# Patient Record
Sex: Male | Born: 1935 | Race: White | Hispanic: No | Marital: Married
Health system: Midwestern US, Academic
[De-identification: ages and names within clinical notes are randomized; demographics above are authoritative.]

---

## 2017-10-23 ENCOUNTER — Encounter: Admit: 2017-10-23 | Discharge: 2017-10-23 | Payer: MEDICARE

## 2017-10-23 ENCOUNTER — Encounter: Admit: 2017-10-23 | Discharge: 2017-10-24 | Payer: MEDICARE

## 2017-10-23 DIAGNOSIS — R160 Hepatomegaly, not elsewhere classified: Secondary | ICD-10-CM

## 2017-10-23 DIAGNOSIS — E785 Hyperlipidemia, unspecified: ICD-10-CM

## 2017-10-23 DIAGNOSIS — K2210 Ulcer of esophagus without bleeding: ICD-10-CM

## 2017-10-23 DIAGNOSIS — N19 Unspecified kidney failure: Principal | ICD-10-CM

## 2017-10-23 MED ORDER — PIPERACILLIN/TAZOBACTAM 4.5 G/NS IVPB (MB+)
4.5 g | INTRAVENOUS | 0 refills | Status: DC
Start: 2017-10-23 — End: 2017-10-24
  Administered 2017-10-24 (×4): 4.5 g via INTRAVENOUS

## 2017-10-24 ENCOUNTER — Encounter: Admit: 2017-10-24 | Discharge: 2017-10-24 | Payer: MEDICARE

## 2017-10-24 ENCOUNTER — Inpatient Hospital Stay
Admit: 2017-10-24 | Discharge: 2017-11-03 | Disposition: A | Discharge status: Skilled Nursing Facility | Payer: MEDICARE | Source: Other Acute Inpatient Hospital

## 2017-10-24 DIAGNOSIS — N19 Unspecified kidney failure: Principal | ICD-10-CM

## 2017-10-24 DIAGNOSIS — E785 Hyperlipidemia, unspecified: ICD-10-CM

## 2017-10-24 DIAGNOSIS — R160 Hepatomegaly, not elsewhere classified: Secondary | ICD-10-CM

## 2017-10-24 LAB — URINALYSIS DIPSTICK
Lab: NEGATIVE (ref 3–12)
Lab: NEGATIVE MMOL/L (ref 21–30)
Lab: NEGATIVE mL/min
Lab: NEGATIVE
Lab: POSITIVE — AB

## 2017-10-24 LAB — CA19.9: Lab: 27 U/mL — ABNORMAL HIGH (ref <35)

## 2017-10-24 LAB — COMPREHENSIVE METABOLIC PANEL
Lab: 1.3 mg/dL — ABNORMAL HIGH (ref 0.3–1.2)
Lab: 101 MMOL/L (ref 98–110)
Lab: 126 U/L — ABNORMAL HIGH (ref 7–40)
Lab: 133 MMOL/L — ABNORMAL LOW (ref 137–147)
Lab: 196 U/L — ABNORMAL HIGH (ref 7–56)
Lab: 229 U/L — ABNORMAL HIGH (ref 25–110)
Lab: 23 MMOL/L (ref 21–30)
Lab: 3.4 g/dL — ABNORMAL LOW (ref 3.5–5.0)
Lab: 55 mL/min — ABNORMAL LOW (ref >60)
Lab: 6.8 g/dL (ref 6.0–8.0)
Lab: 9 (ref 3–12)
Lab: >60 mL/min (ref >60)

## 2017-10-24 LAB — URINALYSIS, MICROSCOPIC

## 2017-10-24 LAB — BILIRUBIN, DIRECT: Lab: 0.8 mg/dL — ABNORMAL HIGH (ref <0.4)

## 2017-10-24 LAB — SODIUM-URINE RANDOM: Lab: 15 MMOL/L

## 2017-10-24 LAB — ALPHA FETO PROTEIN (AFP): Lab: 3.4 ng/mL — ABNORMAL HIGH (ref 0.0–15.0)

## 2017-10-24 LAB — CREATININE-URINE RANDOM: Lab: 144 mg/dL

## 2017-10-24 LAB — CBC
Lab: 18 10*3/uL — ABNORMAL HIGH (ref 4.5–11.0)
Lab: 32 pg — ABNORMAL HIGH (ref 26–34)
Lab: 4 M/UL — ABNORMAL LOW (ref 4.4–5.5)

## 2017-10-24 LAB — LACTIC ACID(LACTATE): Lab: 1.7 MMOL/L (ref 0.5–2.0)

## 2017-10-24 LAB — PROTIME INR (PT): Lab: 1.3 MMOL/L — ABNORMAL HIGH (ref 0.8–1.2)

## 2017-10-24 LAB — CEA(CARCINOEMBRYONIC AG): Lab: 2 ng/mL (ref <3.0)

## 2017-10-24 LAB — UREA NITROGEN-URINE RANDOM: Lab: 118 mg/dL

## 2017-10-24 MED ORDER — ONDANSETRON HCL (PF) 4 MG/2 ML IJ SOLN
4 mg | INTRAVENOUS | 0 refills | Status: DC | PRN
Start: 2017-10-24 — End: 2017-11-03

## 2017-10-24 MED ORDER — SODIUM CHLORIDE 0.9 % IJ SOLN
50 mL | Freq: Once | INTRAVENOUS | 0 refills | Status: CP
Start: 2017-10-24 — End: ?
  Administered 2017-10-24: 09:00:00 50 mL via INTRAVENOUS

## 2017-10-24 MED ORDER — PIPERACILLIN/TAZOBACTAM 3.375 G/NS IVPB (MB+)
3.375 g | INTRAVENOUS | 0 refills | Status: DC
Start: 2017-10-24 — End: 2017-10-24

## 2017-10-24 MED ORDER — PRAVASTATIN 40 MG PO TAB
40 mg | Freq: Every evening | ORAL | 0 refills | Status: DC
Start: 2017-10-24 — End: 2017-10-24

## 2017-10-24 MED ORDER — MELATONIN 3 MG PO TAB
3 mg | Freq: Every evening | ORAL | 0 refills | Status: DC | PRN
Start: 2017-10-24 — End: 2017-10-25
  Administered 2017-10-25: 02:00:00 3 mg via ORAL

## 2017-10-24 MED ORDER — LORAZEPAM 2 MG/ML IJ SOLN
1 mg | Freq: Once | INTRAVENOUS | 0 refills | Status: CP
Start: 2017-10-24 — End: ?
  Administered 2017-10-24: 08:00:00 1 mg via INTRAVENOUS

## 2017-10-24 MED ORDER — DOCUSATE SODIUM 100 MG PO CAP
100 mg | Freq: Every day | ORAL | 0 refills | Status: DC | PRN
Start: 2017-10-24 — End: 2017-11-03

## 2017-10-24 MED ORDER — ACETYLCYSTEINE 600 MG PO CAP
1200 mg | Freq: Two times a day (BID) | ORAL | 0 refills | Status: CP
Start: 2017-10-24 — End: ?
  Administered 2017-10-24 – 2017-10-26 (×4): 1200 mg via ORAL

## 2017-10-24 MED ORDER — TRAZODONE 50 MG PO TAB
50 mg | Freq: Every evening | ORAL | 0 refills | Status: DC | PRN
Start: 2017-10-24 — End: 2017-10-29
  Administered 2017-10-25 – 2017-10-29 (×5): 50 mg via ORAL

## 2017-10-24 MED ORDER — SODIUM CHLORIDE 0.9 % IV SOLP
1000 mL | INTRAVENOUS | 0 refills | Status: AC
Start: 2017-10-24 — End: ?
  Administered 2017-10-24 – 2017-10-26 (×4): 1000 mL via INTRAVENOUS

## 2017-10-24 MED ORDER — IOHEXOL 350 MG IODINE/ML IV SOLN
65 mL | Freq: Once | INTRAVENOUS | 0 refills | Status: CP
Start: 2017-10-24 — End: ?
  Administered 2017-10-24: 09:00:00 65 mL via INTRAVENOUS

## 2017-10-24 MED ORDER — CALCIUM CARBONATE 200 MG CALCIUM (500 MG) PO CHEW
500 mg | ORAL | 0 refills | Status: DC | PRN
Start: 2017-10-24 — End: 2017-11-03
  Administered 2017-10-24 – 2017-10-28 (×7): 500 mg via ORAL

## 2017-10-24 MED ORDER — ACETAMINOPHEN 325 MG PO TAB
650 mg | ORAL | 0 refills | Status: DC | PRN
Start: 2017-10-24 — End: 2017-10-24

## 2017-10-25 ENCOUNTER — Encounter: Admit: 2017-10-25 | Discharge: 2017-10-25 | Payer: MEDICARE

## 2017-10-25 LAB — IRON + BINDING CAPACITY + %SAT+ FERRITIN
Lab: 16 ug/dL — ABNORMAL LOW (ref 50–185)
Lab: 203 ug/dL — ABNORMAL LOW (ref 270–380)
Lab: 8 % — ABNORMAL LOW (ref 28–42)

## 2017-10-25 LAB — HEPATITIS B SURFACE AG: Lab: NEGATIVE (ref 3–12)

## 2017-10-25 LAB — PROCALCITONIN: Lab: 3.7 ng/mL — ABNORMAL HIGH (ref <0.10)

## 2017-10-25 LAB — COMPREHENSIVE METABOLIC PANEL: Lab: 133 MMOL/L — ABNORMAL LOW (ref >60)

## 2017-10-25 LAB — CBC: Lab: 3.8 M/UL — ABNORMAL LOW (ref 4.4–5.5)

## 2017-10-25 MED ORDER — MELATONIN 5 MG PO TAB
10 mg | Freq: Every evening | ORAL | 0 refills | Status: DC | PRN
Start: 2017-10-25 — End: 2017-11-03
  Administered 2017-10-27 – 2017-11-03 (×6): 10 mg via ORAL

## 2017-10-25 MED ORDER — GADOXETATE 0.25 MMOL/ML (181.43 MG/ML) IV SOLN
10 mL | Freq: Once | INTRAVENOUS | 0 refills | Status: CP
Start: 2017-10-25 — End: ?
  Administered 2017-10-25: 21:00:00 10 mL via INTRAVENOUS

## 2017-10-25 MED ORDER — PHYTONADIONE IVPB
10 mg | INTRAVENOUS | 0 refills | Status: CP
Start: 2017-10-25 — End: ?
  Administered 2017-10-25 – 2017-10-27 (×6): 10 mg via INTRAVENOUS

## 2017-10-25 MED ORDER — MELATONIN 5 MG PO TAB
5 mg | Freq: Every evening | ORAL | 0 refills | Status: DC | PRN
Start: 2017-10-25 — End: 2017-10-25

## 2017-10-25 MED ORDER — POLYETHYLENE GLYCOL 3350 17 GRAM PO PWPK
1 | Freq: Every day | ORAL | 0 refills | Status: DC
Start: 2017-10-25 — End: 2017-11-03
  Administered 2017-10-25 – 2017-10-30 (×5): 17 g via ORAL

## 2017-10-25 MED ORDER — LORAZEPAM 2 MG/ML IJ SOLN
.5-1 mg | Freq: Once | INTRAVENOUS | 0 refills | Status: CP
Start: 2017-10-25 — End: ?
  Administered 2017-10-25: 20:00:00 1 mg via INTRAVENOUS

## 2017-10-25 MED ORDER — TRAZODONE 50 MG PO TAB
50 mg | Freq: Once | ORAL | 0 refills | Status: CP
Start: 2017-10-25 — End: ?
  Administered 2017-10-25: 08:00:00 50 mg via ORAL

## 2017-10-26 ENCOUNTER — Inpatient Hospital Stay: Admit: 2017-10-26 | Discharge: 2017-10-26 | Payer: MEDICARE

## 2017-10-26 ENCOUNTER — Encounter: Admit: 2017-10-26 | Discharge: 2017-10-26 | Payer: MEDICARE

## 2017-10-26 DIAGNOSIS — R160 Hepatomegaly, not elsewhere classified: Secondary | ICD-10-CM

## 2017-10-26 DIAGNOSIS — N19 Unspecified kidney failure: Principal | ICD-10-CM

## 2017-10-26 DIAGNOSIS — E785 Hyperlipidemia, unspecified: ICD-10-CM

## 2017-10-26 LAB — GRAM STAIN

## 2017-10-26 LAB — ANTI-NUCLEAR ANTIBODY(ANA): Lab: <80 {titer} (ref <80)

## 2017-10-26 LAB — MANUAL DIFF
Lab: 3 % — ABNORMAL LOW (ref 24–44)
Lab: 4 % (ref 4–12)
Lab: 93 % — ABNORMAL HIGH (ref 41–77)

## 2017-10-26 LAB — ANTI-SMOOTH MUSCLE AB: Lab: <20 {titer} (ref <20)

## 2017-10-26 LAB — ANTI-MITOCHONDRIAL ANTIBODY: Lab: <20 {titer} (ref <20)

## 2017-10-26 LAB — COMPREHENSIVE METABOLIC PANEL
Lab: 136 MMOL/L — ABNORMAL LOW (ref 137–147)
Lab: 3.5 MMOL/L — ABNORMAL LOW (ref 3.5–5.1)

## 2017-10-26 MED ORDER — MIDAZOLAM 1 MG/ML IJ SOLN
.5 mg | Freq: Once | INTRAVENOUS | 0 refills | Status: CP
Start: 2017-10-26 — End: ?
  Administered 2017-10-26: 16:00:00 0.5 mg via INTRAVENOUS

## 2017-10-26 MED ORDER — SODIUM CHLORIDE 0.9 % IJ SYRG
10 mL | Freq: Two times a day (BID) | INTRAVENOUS | 0 refills | Status: CN
Start: 2017-10-26 — End: ?

## 2017-10-26 MED ORDER — FENTANYL CITRATE (PF) 50 MCG/ML IJ SOLN
50 ug | Freq: Once | INTRAVENOUS | 0 refills | Status: CP
Start: 2017-10-26 — End: ?
  Administered 2017-10-26: 16:00:00 25 ug via INTRAVENOUS

## 2017-10-26 MED ORDER — PIPERACILLIN/TAZOBACTAM 4.5 G/NS IVPB (MB+)
4.5 g | INTRAVENOUS | 0 refills | Status: DC
Start: 2017-10-26 — End: 2017-10-27
  Administered 2017-10-26 – 2017-10-27 (×8): 4.5 g via INTRAVENOUS

## 2017-10-27 LAB — GRAM STAIN

## 2017-10-27 LAB — MAGNESIUM: Lab: 2.1 mg/dL (ref 1.6–2.6)

## 2017-10-27 LAB — COMPREHENSIVE METABOLIC PANEL
Lab: 135 MMOL/L — ABNORMAL LOW (ref >60)
Lab: 3.2 MMOL/L — ABNORMAL LOW (ref >60)

## 2017-10-27 LAB — CBC AND DIFF: Lab: 18 10*3/uL — ABNORMAL HIGH (ref 4.5–11.0)

## 2017-10-27 MED ORDER — MAGNESIUM SULFATE IN D5W 1 GRAM/100 ML IV PGBK
1 g | INTRAVENOUS | 0 refills | Status: CP
Start: 2017-10-27 — End: ?
  Administered 2017-10-27 (×2): 1 g via INTRAVENOUS

## 2017-10-27 MED ORDER — PIPERACILLIN/TAZOBACTAM 3.375 G/NS IVPB (MB+)
3.375 g | INTRAVENOUS | 0 refills | Status: DC
Start: 2017-10-27 — End: 2017-10-31
  Administered 2017-10-27 – 2017-10-31 (×31): 3.375 g via INTRAVENOUS

## 2017-10-27 MED ORDER — ENOXAPARIN 40 MG/0.4 ML SC SYRG
40 mg | Freq: Every day | SUBCUTANEOUS | 0 refills | Status: DC
Start: 2017-10-27 — End: 2017-10-30
  Administered 2017-10-28 – 2017-10-30 (×3): 40 mg via SUBCUTANEOUS

## 2017-10-27 MED ORDER — POTASSIUM CHLORIDE 20 MEQ PO TBTQ
60 meq | Freq: Once | ORAL | 0 refills | Status: CP
Start: 2017-10-27 — End: ?
  Administered 2017-10-27: 16:00:00 60 meq via ORAL

## 2017-10-27 MED ORDER — SODIUM CHLORIDE 0.9 % FLUSH
10 mL | Freq: Two times a day (BID) | INTRAMUSCULAR | 0 refills | Status: DC
Start: 2017-10-27 — End: 2017-11-03
  Administered 2017-10-27 – 2017-11-03 (×15): 10 mL via INTRAMUSCULAR

## 2017-10-28 LAB — COMPREHENSIVE METABOLIC PANEL: Lab: 135 MMOL/L — ABNORMAL LOW (ref >60)

## 2017-10-28 LAB — MAGNESIUM: Lab: 2.3 mg/dL — ABNORMAL LOW (ref >60)

## 2017-10-28 LAB — CBC AND DIFF: Lab: 23 K/UL — ABNORMAL HIGH (ref >60)

## 2017-10-29 LAB — MAGNESIUM: Lab: 2.2 mg/dL — ABNORMAL LOW (ref 1.6–2.6)

## 2017-10-29 LAB — CULTURE-RESP,LOWER W/SENSITIVITY: Lab: LOW

## 2017-10-29 LAB — CULTURE-BLOOD W/SENSITIVITY

## 2017-10-29 LAB — CBC AND DIFF
Lab: 2.7 M/UL — ABNORMAL LOW (ref >60)
Lab: 23 K/UL — ABNORMAL HIGH (ref >60)

## 2017-10-29 LAB — COMPREHENSIVE METABOLIC PANEL: Lab: 135 MMOL/L — ABNORMAL LOW (ref 137–147)

## 2017-10-29 MED ORDER — FAMOTIDINE 20 MG PO TAB
20 mg | Freq: Two times a day (BID) | ORAL | 0 refills | Status: DC | PRN
Start: 2017-10-29 — End: 2017-11-03
  Administered 2017-10-29: 22:00:00 20 mg via ORAL

## 2017-10-29 MED ORDER — TRAZODONE 100 MG PO TAB
100 mg | Freq: Every evening | ORAL | 0 refills | Status: DC | PRN
Start: 2017-10-29 — End: 2017-11-03
  Administered 2017-10-30 – 2017-11-02 (×4): 100 mg via ORAL

## 2017-10-30 LAB — OCCULT BLOOD NON COLON CANCER SCREEN: Lab: POSITIVE g/dL (ref 3.5–5.0)

## 2017-10-30 LAB — CBC AND DIFF: Lab: 19 K/UL — ABNORMAL HIGH (ref 4.5–11.0)

## 2017-10-30 LAB — MAGNESIUM: Lab: 2.2 mg/dL — ABNORMAL LOW (ref 1.6–2.6)

## 2017-10-30 LAB — COMPREHENSIVE METABOLIC PANEL: Lab: 137 MMOL/L — ABNORMAL LOW (ref >60)

## 2017-10-30 LAB — CULTURE-BLOOD W/SENSITIVITY

## 2017-10-30 MED ORDER — HYDROXYZINE HCL 25 MG PO TAB
25 mg | Freq: Once | ORAL | 0 refills | Status: CP
Start: 2017-10-30 — End: ?
  Administered 2017-10-31: 01:00:00 25 mg via ORAL

## 2017-10-30 MED ORDER — PANTOPRAZOLE 40 MG IV SOLR
40 mg | Freq: Two times a day (BID) | INTRAVENOUS | 0 refills | Status: DC
Start: 2017-10-30 — End: 2017-10-31
  Administered 2017-10-30 – 2017-10-31 (×3): 40 mg via INTRAVENOUS

## 2017-10-31 ENCOUNTER — Encounter: Admit: 2017-10-31 | Discharge: 2017-10-31 | Payer: MEDICARE

## 2017-10-31 ENCOUNTER — Inpatient Hospital Stay: Admit: 2017-10-31 | Discharge: 2017-10-31 | Payer: MEDICARE

## 2017-10-31 DIAGNOSIS — N19 Unspecified kidney failure: Principal | ICD-10-CM

## 2017-10-31 DIAGNOSIS — E785 Hyperlipidemia, unspecified: ICD-10-CM

## 2017-10-31 LAB — MAGNESIUM: Lab: 2.2 mg/dL — ABNORMAL HIGH (ref >60)

## 2017-10-31 LAB — CULTURE-WOUND/TISSUE/FLUID(AEROBIC ONLY)W/SENSITIVITY

## 2017-10-31 LAB — CBC AND DIFF: Lab: 18 K/UL — ABNORMAL HIGH (ref 4.5–11.0)

## 2017-10-31 LAB — COMPREHENSIVE METABOLIC PANEL: Lab: 135 MMOL/L — ABNORMAL LOW (ref >60)

## 2017-10-31 MED ORDER — LACTATED RINGERS IV SOLP
1000 mL | INTRAVENOUS | 0 refills | Status: DC
Start: 2017-10-31 — End: 2017-11-01
  Administered 2017-10-31: 22:00:00 1000 mL via INTRAVENOUS

## 2017-10-31 MED ORDER — PANTOPRAZOLE 40 MG PO TBEC
40 mg | Freq: Two times a day (BID) | ORAL | 0 refills | Status: DC
Start: 2017-10-31 — End: 2017-11-03
  Administered 2017-11-01 – 2017-11-02 (×4): 40 mg via ORAL

## 2017-10-31 MED ORDER — LORAZEPAM 2 MG/ML IJ SOLN
1 mg | Freq: Once | INTRAVENOUS | 0 refills | Status: DC
Start: 2017-10-31 — End: 2017-10-31

## 2017-10-31 MED ORDER — SODIUM CHLORIDE 0.9 % IV SOLP
INTRAVENOUS | 0 refills | Status: CN
Start: 2017-10-31 — End: ?

## 2017-10-31 MED ORDER — HYDROXYZINE HCL 25 MG PO TAB
25 mg | Freq: Every evening | ORAL | 0 refills | Status: DC | PRN
Start: 2017-10-31 — End: 2017-11-01
  Administered 2017-11-01: 03:00:00 25 mg via ORAL

## 2017-10-31 MED ORDER — IOHEXOL 350 MG IODINE/ML IV SOLN
80 mL | Freq: Once | INTRAVENOUS | 0 refills | Status: CP
Start: 2017-10-31 — End: ?
  Administered 2017-10-31: 17:00:00 80 mL via INTRAVENOUS

## 2017-10-31 MED ORDER — LORAZEPAM 2 MG/ML IJ SOLN
.5-1 mg | Freq: Once | INTRAVENOUS | 0 refills | Status: CP | PRN
Start: 2017-10-31 — End: ?
  Administered 2017-10-31: 16:00:00 1 mg via INTRAVENOUS

## 2017-10-31 MED ORDER — SODIUM CHLORIDE 0.9 % IJ SOLN
50 mL | Freq: Once | INTRAVENOUS | 0 refills | Status: CP
Start: 2017-10-31 — End: ?
  Administered 2017-10-31: 17:00:00 50 mL via INTRAVENOUS

## 2017-10-31 MED ORDER — LIDOCAINE (PF) 200 MG/10 ML (2 %) IJ SYRG
0 refills | Status: DC
Start: 2017-10-31 — End: 2017-10-31
  Administered 2017-10-31: 20:00:00 100 mg via INTRAVENOUS

## 2017-10-31 MED ORDER — ERTAPENEM 1GM IVP
1 g | INTRAVENOUS | 0 refills | Status: DC
Start: 2017-10-31 — End: 2017-11-03
  Administered 2017-10-31 – 2017-11-02 (×3): 1 g via INTRAVENOUS

## 2017-10-31 MED ORDER — SIMETHICONE 80 MG PO CHEW
80 mg | ORAL | 0 refills | Status: DC | PRN
Start: 2017-10-31 — End: 2017-11-03
  Administered 2017-11-01: 03:00:00 80 mg via ORAL

## 2017-10-31 MED ORDER — LACTATED RINGERS IV SOLP
0 refills | Status: DC
Start: 2017-10-31 — End: 2017-10-31
  Administered 2017-10-31: 20:00:00 via INTRAVENOUS

## 2017-10-31 MED ORDER — PROPOFOL INJ 10 MG/ML IV VIAL
0 refills | Status: DC
Start: 2017-10-31 — End: 2017-10-31
  Administered 2017-10-31: 20:00:00 10 mg via INTRAVENOUS
  Administered 2017-10-31 (×4): 20 mg via INTRAVENOUS

## 2017-11-01 ENCOUNTER — Encounter: Admit: 2017-11-01 | Discharge: 2017-11-01 | Payer: MEDICARE

## 2017-11-01 ENCOUNTER — Inpatient Hospital Stay: Admit: 2017-11-01 | Discharge: 2017-11-01 | Payer: MEDICARE

## 2017-11-01 ENCOUNTER — Ambulatory Visit: Admit: 2017-11-01 | Discharge: 2017-11-01 | Payer: MEDICARE

## 2017-11-01 DIAGNOSIS — E785 Hyperlipidemia, unspecified: ICD-10-CM

## 2017-11-01 DIAGNOSIS — N19 Unspecified kidney failure: Principal | ICD-10-CM

## 2017-11-01 DIAGNOSIS — R160 Hepatomegaly, not elsewhere classified: Secondary | ICD-10-CM

## 2017-11-01 LAB — CBC AND DIFF: Lab: 19 K/UL — ABNORMAL HIGH (ref 4.5–11.0)

## 2017-11-01 LAB — COMPREHENSIVE METABOLIC PANEL: Lab: 136 MMOL/L — ABNORMAL LOW (ref >60)

## 2017-11-01 LAB — MAGNESIUM: Lab: 2.4 mg/dL — ABNORMAL LOW (ref >60)

## 2017-11-01 LAB — AMEBIASIS AB IGG: Lab: NEGATIVE g/dL — ABNORMAL LOW (ref 13.5–16.5)

## 2017-11-01 LAB — STRONGYLOIDES AB IGG: Lab: 0.5 M/UL (ref 4.4–5.5)

## 2017-11-01 MED ORDER — IOPAMIDOL 61 % IV SOLN
20 mL | Freq: Once | 0 refills | Status: CP
Start: 2017-11-01 — End: ?
  Administered 2017-11-01: 23:00:00 20 mL

## 2017-11-01 MED ORDER — MIDAZOLAM 1 MG/ML IJ SOLN
1 mg | Freq: Once | INTRAVENOUS | 0 refills | Status: CP
Start: 2017-11-01 — End: ?
  Administered 2017-11-01: 23:00:00 1 mg via INTRAVENOUS

## 2017-11-01 MED ORDER — HYDROXYZINE HCL 25 MG PO TAB
25 mg | Freq: Every evening | ORAL | 0 refills | Status: DC | PRN
Start: 2017-11-01 — End: 2017-11-03
  Administered 2017-11-02: 02:00:00 25 mg via ORAL

## 2017-11-01 MED ORDER — FENTANYL CITRATE (PF) 50 MCG/ML IJ SOLN
0 refills | Status: CP
Start: 2017-11-01 — End: ?
  Administered 2017-11-01: 23:00:00 25 ug via INTRAVENOUS
  Administered 2017-11-01: 23:00:00 50 ug via INTRAVENOUS

## 2017-11-01 MED ORDER — LORAZEPAM 2 MG/ML IJ SOLN
.5-1 mg | Freq: Once | INTRAVENOUS | 0 refills | Status: AC | PRN
Start: 2017-11-01 — End: ?

## 2017-11-01 MED ORDER — FENTANYL CITRATE (PF) 50 MCG/ML IJ SOLN
50 ug | Freq: Once | INTRAVENOUS | 0 refills | Status: CP
Start: 2017-11-01 — End: ?
  Administered 2017-11-01: 23:00:00 50 ug via INTRAVENOUS

## 2017-11-01 MED ORDER — HYDROXYZINE HCL 25 MG PO TAB
25 mg | Freq: Three times a day (TID) | ORAL | 0 refills | Status: DC | PRN
Start: 2017-11-01 — End: 2017-11-01

## 2017-11-01 MED ORDER — MIDAZOLAM 1 MG/ML IJ SOLN
0 refills | Status: CP
Start: 2017-11-01 — End: ?
  Administered 2017-11-01: 23:00:00 1 mg via INTRAVENOUS

## 2017-11-02 ENCOUNTER — Encounter: Admit: 2017-11-02 | Discharge: 2017-11-02 | Payer: MEDICARE

## 2017-11-02 LAB — GRAM STAIN

## 2017-11-02 LAB — HEMOGLOBIN: Lab: 9.2 g/dL — ABNORMAL LOW (ref 13.5–16.5)

## 2017-11-02 LAB — MAGNESIUM: Lab: 2.3 mg/dL — ABNORMAL LOW (ref 1.6–2.6)

## 2017-11-02 LAB — TROPONIN-I

## 2017-11-02 LAB — CBC AND DIFF: Lab: 15 K/UL — ABNORMAL HIGH (ref 4.5–11.0)

## 2017-11-02 LAB — COMPREHENSIVE METABOLIC PANEL: Lab: 137 MMOL/L — ABNORMAL LOW (ref >60)

## 2017-11-02 MED ORDER — TRAZODONE 100 MG PO TAB
100 mg | ORAL_TABLET | Freq: Every evening | ORAL | 0 refills | Status: SS | PRN
Start: 2017-11-02 — End: 2018-11-21

## 2017-11-02 MED ORDER — PANTOPRAZOLE 40 MG PO TBEC
40 mg | Freq: Two times a day (BID) | ORAL | 0 refills | Status: SS
Start: 2017-11-02 — End: 2018-11-21

## 2017-11-02 MED ORDER — ERTAPENEM 1GM IVP
1 g | INTRAVENOUS | 0 refills | Status: AC
Start: 2017-11-02 — End: 2017-12-06

## 2017-11-03 ENCOUNTER — Inpatient Hospital Stay: Admit: 2017-10-31 | Discharge: 2017-10-31 | Payer: MEDICARE

## 2017-11-03 ENCOUNTER — Inpatient Hospital Stay: Admit: 2017-10-25 | Discharge: 2017-10-25 | Payer: MEDICARE

## 2017-11-03 ENCOUNTER — Inpatient Hospital Stay: Admit: 2017-10-28 | Discharge: 2017-10-28 | Payer: MEDICARE

## 2017-11-03 ENCOUNTER — Inpatient Hospital Stay: Admit: 2017-10-24 | Discharge: 2017-10-24 | Payer: MEDICARE

## 2017-11-03 ENCOUNTER — Inpatient Hospital Stay: Admit: 2017-10-26 | Discharge: 2017-10-26 | Payer: MEDICARE

## 2017-11-03 ENCOUNTER — Inpatient Hospital Stay: Admit: 2017-10-29 | Discharge: 2017-10-29 | Payer: MEDICARE

## 2017-11-03 DIAGNOSIS — K921 Melena: ICD-10-CM

## 2017-11-03 DIAGNOSIS — E860 Dehydration: ICD-10-CM

## 2017-11-03 DIAGNOSIS — R6520 Severe sepsis without septic shock: ICD-10-CM

## 2017-11-03 DIAGNOSIS — F40240 Claustrophobia: ICD-10-CM

## 2017-11-03 DIAGNOSIS — J90 Pleural effusion, not elsewhere classified: ICD-10-CM

## 2017-11-03 DIAGNOSIS — G4700 Insomnia, unspecified: ICD-10-CM

## 2017-11-03 DIAGNOSIS — K3189 Other diseases of stomach and duodenum: ICD-10-CM

## 2017-11-03 DIAGNOSIS — K210 Gastro-esophageal reflux disease with esophagitis: ICD-10-CM

## 2017-11-03 DIAGNOSIS — D62 Acute posthemorrhagic anemia: ICD-10-CM

## 2017-11-03 DIAGNOSIS — Z9049 Acquired absence of other specified parts of digestive tract: ICD-10-CM

## 2017-11-03 DIAGNOSIS — K831 Obstruction of bile duct: ICD-10-CM

## 2017-11-03 DIAGNOSIS — E785 Hyperlipidemia, unspecified: ICD-10-CM

## 2017-11-03 DIAGNOSIS — Z6830 Body mass index (BMI) 30.0-30.9, adult: ICD-10-CM

## 2017-11-03 DIAGNOSIS — N179 Acute kidney failure, unspecified: ICD-10-CM

## 2017-11-03 DIAGNOSIS — Z87891 Personal history of nicotine dependence: ICD-10-CM

## 2017-11-03 DIAGNOSIS — K750 Abscess of liver: ICD-10-CM

## 2017-11-03 DIAGNOSIS — K449 Diaphragmatic hernia without obstruction or gangrene: ICD-10-CM

## 2017-11-03 DIAGNOSIS — Z791 Long term (current) use of non-steroidal anti-inflammatories (NSAID): ICD-10-CM

## 2017-11-03 DIAGNOSIS — A419 Sepsis, unspecified organism: Principal | ICD-10-CM

## 2017-11-03 DIAGNOSIS — R160 Hepatomegaly, not elsewhere classified: Secondary | ICD-10-CM

## 2017-11-03 DIAGNOSIS — E872 Acidosis: ICD-10-CM

## 2017-11-03 LAB — POC GLUCOSE: Lab: 109 mg/dL — ABNORMAL HIGH (ref 70–100)

## 2017-11-04 ENCOUNTER — Encounter: Admit: 2017-11-04 | Discharge: 2017-11-04 | Payer: MEDICARE

## 2017-11-04 DIAGNOSIS — E611 Iron deficiency: ICD-10-CM

## 2017-11-04 DIAGNOSIS — K750 Abscess of liver: Principal | ICD-10-CM

## 2017-11-04 LAB — CULTURE-BLOOD W/SENSITIVITY

## 2017-11-05 LAB — CULTURE-BLOOD W/SENSITIVITY

## 2017-11-06 LAB — CULTURE-WOUND/TISSUE/FLUID(AEROBIC ONLY)W/SENSITIVITY

## 2017-11-07 LAB — CREATININE: Lab: 0.7

## 2017-11-07 LAB — PLATELET COUNT: Lab: 437

## 2017-11-07 LAB — BUN: Lab: 12

## 2017-11-07 LAB — ALK PHOS TOTAL: Lab: 285

## 2017-11-07 LAB — POTASSIUM: Lab: 4.3

## 2017-11-07 LAB — ALT (SGPT): Lab: 46

## 2017-11-07 LAB — CBC: Lab: 8.6

## 2017-11-07 LAB — HEMOGLOBIN: Lab: 9.1

## 2017-11-07 LAB — AST (SGOT): Lab: 34

## 2017-11-09 ENCOUNTER — Encounter: Admit: 2017-11-09 | Discharge: 2017-11-10 | Payer: MEDICARE

## 2017-11-09 DIAGNOSIS — R69 Illness, unspecified: Principal | ICD-10-CM

## 2017-11-11 ENCOUNTER — Encounter: Admit: 2017-11-11 | Discharge: 2017-11-11 | Payer: MEDICARE

## 2017-11-14 ENCOUNTER — Encounter: Admit: 2017-11-14 | Discharge: 2017-11-14 | Payer: MEDICARE

## 2017-11-14 LAB — ALT (SGPT): Lab: 26

## 2017-11-14 LAB — AST (SGOT): Lab: 24

## 2017-11-14 LAB — POTASSIUM: Lab: 4.3

## 2017-11-14 LAB — HEMOGLOBIN: Lab: 9.8

## 2017-11-14 LAB — ALK PHOS TOTAL: Lab: 190

## 2017-11-14 LAB — CBC: Lab: 8.1

## 2017-11-14 LAB — BUN: Lab: 9

## 2017-11-14 LAB — PLATELET COUNT: Lab: 351

## 2017-11-14 LAB — CREATININE: Lab: 0.8

## 2017-11-15 ENCOUNTER — Encounter: Admit: 2017-11-15 | Discharge: 2017-11-15 | Payer: MEDICARE

## 2017-11-15 DIAGNOSIS — R69 Illness, unspecified: Principal | ICD-10-CM

## 2017-11-16 ENCOUNTER — Encounter: Admit: 2017-11-16 | Discharge: 2017-11-16 | Payer: MEDICARE

## 2017-11-16 DIAGNOSIS — K750 Abscess of liver: Principal | ICD-10-CM

## 2017-11-17 ENCOUNTER — Encounter: Admit: 2017-11-17 | Discharge: 2017-11-17 | Payer: MEDICARE

## 2017-11-18 ENCOUNTER — Encounter: Admit: 2017-11-18 | Discharge: 2017-11-18 | Payer: MEDICARE

## 2017-11-21 ENCOUNTER — Encounter: Admit: 2017-11-21 | Discharge: 2017-11-21 | Payer: MEDICARE

## 2017-11-21 ENCOUNTER — Ambulatory Visit: Admit: 2017-11-21 | Discharge: 2017-11-21 | Payer: MEDICARE

## 2017-11-21 DIAGNOSIS — Z4682 Encounter for fitting and adjustment of non-vascular catheter: Principal | ICD-10-CM

## 2017-11-21 DIAGNOSIS — Z7982 Long term (current) use of aspirin: ICD-10-CM

## 2017-11-21 DIAGNOSIS — R188 Other ascites: ICD-10-CM

## 2017-11-21 DIAGNOSIS — E785 Hyperlipidemia, unspecified: ICD-10-CM

## 2017-11-21 DIAGNOSIS — Z87891 Personal history of nicotine dependence: ICD-10-CM

## 2017-11-21 DIAGNOSIS — Z79899 Other long term (current) drug therapy: ICD-10-CM

## 2017-11-21 DIAGNOSIS — K750 Abscess of liver: Secondary | ICD-10-CM

## 2017-11-21 LAB — BUN: Lab: 9

## 2017-11-21 LAB — CREATININE: Lab: 0.9

## 2017-11-21 LAB — CBC: Lab: 7.2

## 2017-11-21 LAB — POTASSIUM: Lab: 4.1

## 2017-11-21 LAB — HEMOGLOBIN: Lab: 10

## 2017-11-21 LAB — PLATELET COUNT: Lab: 335

## 2017-11-21 MED ORDER — IOPAMIDOL 61 % IV SOLN
3 mL | Freq: Once | INTRA_ARTERIAL | 0 refills | Status: CP
Start: 2017-11-21 — End: ?
  Administered 2017-11-21: 18:00:00 3 mL via INTRA_ARTERIAL

## 2017-11-22 ENCOUNTER — Encounter: Admit: 2017-11-22 | Discharge: 2017-11-22 | Payer: MEDICARE

## 2017-11-22 ENCOUNTER — Ambulatory Visit: Admit: 2017-11-22 | Discharge: 2017-11-23 | Payer: MEDICARE

## 2017-11-22 DIAGNOSIS — E785 Hyperlipidemia, unspecified: ICD-10-CM

## 2017-11-22 DIAGNOSIS — N19 Unspecified kidney failure: Principal | ICD-10-CM

## 2017-11-22 DIAGNOSIS — K750 Abscess of liver: Principal | ICD-10-CM

## 2017-11-23 ENCOUNTER — Encounter: Admit: 2017-11-23 | Discharge: 2017-11-23 | Payer: MEDICARE

## 2017-11-23 DIAGNOSIS — K209 Esophagitis, unspecified: ICD-10-CM

## 2017-11-28 LAB — CULTURE-FUNGAL,OTHER

## 2017-11-28 LAB — AST (SGOT): Lab: 20

## 2017-11-28 LAB — POTASSIUM: Lab: 4.3

## 2017-11-28 LAB — BUN: Lab: 8

## 2017-11-28 LAB — ALT (SGPT): Lab: 13

## 2017-11-30 ENCOUNTER — Encounter: Admit: 2017-11-30 | Discharge: 2017-11-30 | Payer: MEDICARE

## 2017-12-01 ENCOUNTER — Encounter: Admit: 2017-12-01 | Discharge: 2017-12-01 | Payer: MEDICARE

## 2017-12-01 DIAGNOSIS — R7881 Bacteremia: Principal | ICD-10-CM

## 2017-12-05 LAB — CREATININE: Lab: 0.9

## 2017-12-05 LAB — BUN: Lab: 10

## 2017-12-05 LAB — CBC: Lab: 5.2

## 2017-12-05 LAB — ALK PHOS TOTAL: Lab: 117

## 2017-12-05 LAB — ALT (SGPT): Lab: 15

## 2017-12-05 LAB — HEMOGLOBIN: Lab: 11

## 2017-12-05 LAB — POTASSIUM: Lab: 4.2

## 2017-12-05 LAB — AST (SGOT): Lab: 20

## 2017-12-05 LAB — PLATELET COUNT: Lab: 269

## 2017-12-05 LAB — CULTURE-FUNGAL,OTHER

## 2017-12-06 ENCOUNTER — Encounter: Admit: 2017-12-06 | Discharge: 2017-12-06 | Payer: MEDICARE

## 2017-12-09 ENCOUNTER — Ambulatory Visit: Admit: 2017-12-09 | Discharge: 2017-12-09 | Payer: MEDICARE

## 2017-12-09 DIAGNOSIS — K209 Esophagitis, unspecified: ICD-10-CM

## 2017-12-12 LAB — CULTURE-TB (AFB)

## 2017-12-19 ENCOUNTER — Encounter: Admit: 2017-12-19 | Discharge: 2017-12-19 | Payer: MEDICARE

## 2017-12-19 DIAGNOSIS — K750 Abscess of liver: Principal | ICD-10-CM

## 2017-12-19 LAB — COMPREHENSIVE METABOLIC PANEL
Lab: 113
Lab: 26 (ref 5–34)
Lab: 4.3
Lab: 0.6 mg/dL (ref 0.0–1.0)
Lab: 1 mg/dL (ref 0.72–1.25)
Lab: 103 mmol/L (ref 98–107)
Lab: 111 mg/dL — ABNORMAL HIGH (ref 83–110)
Lab: 139 mmol
Lab: 14 meq/L (ref 0–14)
Lab: 17 U/L (ref 0–55)
Lab: 26 mmol/L (ref 23–31)
Lab: 3.3 g/dL — ABNORMAL LOW (ref 3.4–4.9)
Lab: 69 (ref >59)
Lab: 7.5 g/dL (ref 6.2–8.1)
Lab: 9 mg/dL (ref 8.4–25.7)
Lab: 9.2 mg/dL (ref 8.8–10.0)

## 2017-12-22 ENCOUNTER — Ambulatory Visit: Admit: 2017-12-22 | Discharge: 2017-12-22 | Payer: MEDICARE

## 2017-12-22 ENCOUNTER — Encounter: Admit: 2017-12-22 | Discharge: 2017-12-22 | Payer: MEDICARE

## 2017-12-22 DIAGNOSIS — N19 Unspecified kidney failure: Principal | ICD-10-CM

## 2017-12-22 DIAGNOSIS — K750 Abscess of liver: Principal | ICD-10-CM

## 2017-12-22 DIAGNOSIS — E785 Hyperlipidemia, unspecified: ICD-10-CM

## 2017-12-22 DIAGNOSIS — R160 Hepatomegaly, not elsewhere classified: ICD-10-CM

## 2017-12-22 MED ORDER — AMOXICILLIN-POT CLAVULANATE 875-125 MG PO TAB
1 | ORAL_TABLET | Freq: Two times a day (BID) | ORAL | 0 refills | Status: SS
Start: 2017-12-22 — End: 2018-11-21

## 2017-12-22 MED ORDER — IOHEXOL 350 MG IODINE/ML IV SOLN
100 mL | Freq: Once | INTRAVENOUS | 0 refills | Status: CP
Start: 2017-12-22 — End: ?
  Administered 2017-12-22: 15:00:00 100 mL via INTRAVENOUS

## 2017-12-22 MED ORDER — SODIUM CHLORIDE 0.9 % IJ SOLN
50 mL | Freq: Once | INTRAVENOUS | 0 refills | Status: CP
Start: 2017-12-22 — End: ?
  Administered 2017-12-22: 15:00:00 50 mL via INTRAVENOUS

## 2017-12-26 ENCOUNTER — Encounter: Admit: 2017-12-26 | Discharge: 2017-12-26 | Payer: MEDICARE

## 2018-01-10 ENCOUNTER — Encounter: Admit: 2018-01-10 | Discharge: 2018-01-10 | Payer: MEDICARE

## 2018-01-10 DIAGNOSIS — R160 Hepatomegaly, not elsewhere classified: Principal | ICD-10-CM

## 2018-11-19 ENCOUNTER — Encounter: Admit: 2018-11-19 | Discharge: 2018-11-19

## 2018-11-19 ENCOUNTER — Encounter: Admit: 2018-11-19 | Discharge: 2018-11-20

## 2018-11-19 ENCOUNTER — Encounter: Admit: 2018-11-21 | Discharge: 2018-11-21 | Admitting: Student in an Organized Health Care Education/Training Program

## 2018-11-19 ENCOUNTER — Ambulatory Visit
Admit: 2018-11-20 | Discharge: 2018-12-01 | Disposition: A | Discharge status: Skilled Nursing Facility | Source: Other Acute Inpatient Hospital | Admitting: Student in an Organized Health Care Education/Training Program

## 2018-11-19 DIAGNOSIS — R160 Hepatomegaly, not elsewhere classified: Secondary | ICD-10-CM

## 2018-11-19 DIAGNOSIS — K750 Abscess of liver: Secondary | ICD-10-CM

## 2018-11-20 ENCOUNTER — Encounter: Admit: 2018-11-20 | Discharge: 2018-11-20

## 2018-11-20 DIAGNOSIS — E785 Hyperlipidemia, unspecified: Secondary | ICD-10-CM

## 2018-11-20 DIAGNOSIS — N19 Unspecified kidney failure: Secondary | ICD-10-CM

## 2018-11-20 LAB — COMPREHENSIVE METABOLIC PANEL
Lab: 1.7 mg/dL — ABNORMAL HIGH (ref 0.3–1.2)
Lab: 2.9 g/dL — ABNORMAL LOW (ref 3.5–5.0)
Lab: 343 U/L — ABNORMAL HIGH (ref 7–56)
Lab: 422 U/L — ABNORMAL HIGH (ref 25–110)
Lab: 55 mL/min — ABNORMAL LOW (ref >60)
Lab: 91 mg/dL (ref 70–100)
Lab: >60 mL/min (ref >60)
Lab: 9 (ref 3–12)

## 2018-11-20 LAB — URINALYSIS DIPSTICK
Lab: 5 (ref 5.0–8.0)
Lab: NEGATIVE
Lab: NEGATIVE
Lab: NEGATIVE
Lab: NEGATIVE
Lab: NEGATIVE
Lab: POSITIVE — AB

## 2018-11-20 LAB — TROPONIN-I: Lab: 0 ng/mL (ref 0.0–0.05)

## 2018-11-20 LAB — PHOSPHORUS: Lab: 2.1 mg/dL (ref 2.0–4.5)

## 2018-11-20 LAB — CBC AND DIFF
Lab: 19 10*3/uL — ABNORMAL HIGH (ref 4.5–11.0)
Lab: 35 % — ABNORMAL LOW (ref 40–50)

## 2018-11-20 LAB — PROTIME INR (PT): Lab: 1.4 M/UL — ABNORMAL HIGH (ref 0.8–1.2)

## 2018-11-20 LAB — PTT (APTT): Lab: 24 s — ABNORMAL LOW (ref 24.0–36.5)

## 2018-11-20 LAB — URINALYSIS, MICROSCOPIC

## 2018-11-20 LAB — MAGNESIUM: Lab: 1.9 mg/dL (ref 1.6–2.6)

## 2018-11-20 LAB — BILIRUBIN, DIRECT: Lab: 1.1 mg/dL — ABNORMAL HIGH (ref <0.4)

## 2018-11-20 MED ORDER — ERTAPENEM 1GM IVP
1 g | INTRAVENOUS | 0 refills | Status: DC
Start: 2018-11-20 — End: 2018-12-01
  Administered 2018-11-20 – 2018-12-01 (×12): 1 g via INTRAVENOUS

## 2018-11-20 MED ORDER — PHENOL 1.4 % MM SPRA
2 | OROMUCOSAL | 0 refills | Status: DC | PRN
Start: 2018-11-20 — End: 2018-11-29
  Administered 2018-11-20: 10:00:00 2 via OROMUCOSAL

## 2018-11-20 MED ORDER — HEPARIN, PORCINE (PF) 5,000 UNIT/0.5 ML IJ SYRG
5000 [IU] | SUBCUTANEOUS | 0 refills | Status: DC
Start: 2018-11-20 — End: 2018-11-21
  Administered 2018-11-20: 11:00:00 5000 [IU] via SUBCUTANEOUS

## 2018-11-20 MED ORDER — LACTATED RINGERS IV SOLP
500 mL | Freq: Once | INTRAVENOUS | 0 refills | Status: CP
Start: 2018-11-20 — End: ?
  Administered 2018-11-20: 10:00:00 500 mL via INTRAVENOUS

## 2018-11-20 MED ORDER — ASPIRIN 81 MG PO TBEC
81 mg | Freq: Every day | ORAL | 0 refills | Status: DC
Start: 2018-11-20 — End: 2018-11-20

## 2018-11-20 MED ORDER — PATCH DOCUMENTATION - LIDOCAINE 5%
Freq: Two times a day (BID) | TRANSDERMAL | 0 refills | Status: DC
Start: 2018-11-20 — End: 2018-12-01

## 2018-11-20 MED ORDER — ACETAMINOPHEN 325 MG PO TAB
650 mg | ORAL | 0 refills | Status: DC | PRN
Start: 2018-11-20 — End: 2018-11-21

## 2018-11-20 MED ORDER — LIDOCAINE 5 % TP PTMD
1 | Freq: Every day | TOPICAL | 0 refills | Status: DC
Start: 2018-11-20 — End: 2018-12-01
  Administered 2018-11-21 – 2018-11-23 (×4): 1 via TOPICAL

## 2018-11-20 MED ORDER — ACETAMINOPHEN/LIDOCAINE/ANTACID DS(#) 1:1:3  PO SUSP
30 mL | ORAL | 0 refills | Status: DC | PRN
Start: 2018-11-20 — End: 2018-11-24
  Administered 2018-11-21 – 2018-11-24 (×8): 30 mL via ORAL

## 2018-11-20 MED ORDER — ERTAPENEM 1GM IVP
1 g | INTRAVENOUS | 0 refills | Status: DC
Start: 2018-11-20 — End: 2018-11-20

## 2018-11-20 NOTE — Care Plan
Problem: Infection, Risk of  Goal: Absence of infection  Outcome: Goal Ongoing  Flowsheets (Taken 11/20/2018 0523)  Absence of infection:   Assess for infection (Monitor SIRS Criteria)   Monitor for signs and symptoms of infection  Note: Pt assessed for infection. Proper pharmacologic preventions and treatments in place to prevent/treat infection. Pt educated on treatments being given.    Goal: Knowledge of Infection Control Procedures  Outcome: Goal Ongoing  Flowsheets (Taken 11/20/2018 0523)  Knowledge of Infection Control procedures: Provide Isolation Precautions Education     Problem: Falls, High Risk of  Goal: Absence of falls-Adult Patient  Outcome: Goal Ongoing  Flowsheets (Taken 11/20/2018 0523)  Absence of falls-Adult Patient:   Complete Fall Risk Assessment.   Provde safe environment.   Consider additional interventions if patient is confused, has gait/balance problems and on high risk medications.   Provide safe ambulation.   Implement fall risk bundle.   Provide fall prevention strategies.  Note: Fall bundle in place. Pt educated on fall risk status and verbalizes understanding of interventions. Call light within reach.        Problem: Discharge Planning  Goal: Participation in plan of care  Outcome: Goal Ongoing  Flowsheets (Taken 11/20/2018 0523)  Participation in Plan of Care: Involve patient/caregiver in care planning decision making  Note: Pt involved in discharge planning and teaching and verbalizes understanding. Pt questions and concerns addressed regarding discharge    Goal: Knowledge regarding plan of care  Outcome: Goal Ongoing  Flowsheets (Taken 11/20/2018 0523)  Knowledge regarding plan of care:   Provide admission education to parent/caregiver   Provide plan of care education   Provide procedural and treatment education   Provide fall prevention education   Provide infection prevention education   Provide medication management education   Provide VTE signs and symptoms education Provide pre-operative teaching  Goal: Prepared for discharge  Outcome: Goal Ongoing  Flowsheets (Taken 11/20/2018 0523)  Prepared for discharge:   Complete ADL ability assessment   Provide safe use medical equipment education   Provide discharge activity restrictions education   Provide discharge materials appropriate to patient condition   Provide diet and oral health education   Collaborate with multidisciplinary team for hospital discharge coordination     Problem: Pain  Goal: Management of pain  Outcome: Goal Ongoing  Flowsheets (Taken 11/20/2018 0523)  Management of pain:   In the patient who can fully report pain, assess pain characteristics.   In the patient who cannot fully report, observational (behavior) tools are appropriate.   Manage pain.   Assess opioid analgesia side-effects.   Assess pain control barriers.   Complete pain assessment scale according to age, condition and ability to understand.  Note: Pt assessed for pain and given pharmacologic and nonpharmacologic methods for pain management. Will continue to monitor    Goal: Knowledge of pain management  Outcome: Goal Ongoing  Flowsheets (Taken 11/20/2018 0523)  Knowledge of pain management:   Provide pain scale education   Provide pain management methods education   Provide pharmacological pain management education  Note: Pt assessed for pain and given pharmacologic and nonpharmacologic methods for pain management. Will continue to monitor    Goal: Progress Toward Pain Management Goals  Outcome: Goal Ongoing  Flowsheets (Taken 11/20/2018 0523)  Progress toward pain management goals:   Progress toward pain management goals   Assess progress toward pain management goals     Problem: Falls, High Risk of  Goal: Absence of Falls-Pediatric patient  Outcome: Goal  Achieved  Note: Pt not pediatric

## 2018-11-20 NOTE — Case Management (ED)
Case Management Admission Assessment    NAME:Tyler Castillo                          MRN: [id number]             DOB:[birth year]          AGE: 84 y.o.  ADMISSION DATE: 11/20/2018             DAYS ADMITTED: LOS: 0 days      Today???s Date: 11/20/2018    Source of Information: Patient, EMR      Plan  Plan: Case Management Assessment, Discharge Planning for Home Anticipated   -This CM spoke to pt over phone due to Covid-19 precautions for assessment on this date. Provided contact information and explanation of SW/NCM roles.Provided opportunity for questions and discussion. Pt/family encouraged to contact Case Management team with questions and concerns during hospitalization and until patient is able to transition back to the patient's primary care physician.  -Patient lives alone at home. Pt is able to do ADL's by himself. Pt ambulates independently without using any ambulatory aid.  -Pt confirms PCP Dr.Melissa Huntington,last seen 6 months ago  -Pt primary pharmacy [facility] in [place]. Prescriptions are affordable. Part-D prescription plan [employer]  -Son will take pt home upon d/c  -No other NCM needs identified at this time. Will continue to follow.    Discharge Plan:Anticipate pt d/c home when medically stable.    Patient Address/Phone  [address]  [place] [address]  [phone] (home)     Emergency Contact  Extended Emergency Contact Information  Primary Emergency Contact: TALAN, GRUSS  Home Phone: [phone]  Relation: Son  Secondary Emergency Contact: Roger Kill  Mobile Phone: [phone]  Relation: Relative  Preferred language: ENGLISH  Interpreter needed? No    Forensic scientist: Yes, patient has a healthcare directive  Type of Healthcare Directive: Durable power of attorney for healthcare  Location of Healthcare Directive: Patient does not have it with him/her  Would patient like to fill out a (a new) Healthcare Directive?: No, patient declined      Transportation Does the patient need discharge transport arranged?: No  Transportation Name, Phone and Availability #1: Jovontae(Son)-Ph-[phone]  Does the patient use Medicaid Transportation?: No    Expected Discharge Date  Expected Discharge Date: 11/22/18  Expected Discharge Time: 1500    Living Situation Prior to Admission  ? Living Arrangements  Type of Residence: Home, independent  Living Arrangements: Alone  How many levels in the residence?: 1  Can patient live on one level if needed?: Yes  Does residence have entry and/or side stairs?: No  Assistance needed prior to admit or anticipated on discharge: No  Who provides assistance or could if needed?: Family  Are they in good health?: Yes  Can support system provide 24/7 care if needed?: Yes  ? Level of Function   Prior level of function: Independent  ? Cognitive Abilities   Cognitive Abilities: Alert and Oriented, Engages in problem solving and planning, Participates in decision making    Financial Resources  ? Coverage  Primary Insurance: Medicare  Secondary Insurance: Medicare Supplement([employer] [place])  Additional Coverage: RX(Prescriptions covered through [facility])    ? Source of Income   Source Of Income: SSI  ? Financial Assistance Needed?  none    Psychosocial Needs  ? Mental Health  Mental Health History: No  ? Substance Use History  Substance Use History Screen: No  ?  Other  none    Current/Previous Services  ? PCP  Hunting[place], [place], [phone], [phone]  ? Pharmacy    [facility]  [address]  [place] [address]  Phone: [phone] Fax: [phone]    ? Durable Medical Equipment   Durable Medical Equipment at home: Single [employer]  ? Home Health  Receiving home health: No  ? Hemodialysis or Peritoneal Dialysis  Undergoing hemodialysis or peritoneal dialysis: No  ? Tube/Enteral Feeds  Receive tube/enteral feeds: No  ? Infusion  Receive infusions: No  ? Private Duty  Private duty help used: No  ? Home and Community Based Services Home and community based services: No  ? [employer]: N/A  ? Hospice  Hospice: No  ? Outpatient Therapy  PT: No  OT: No  SLP: No  ? Skilled Nursing Facility/Nursing Home  SNF: No  NH: No  ? Inpatient Rehab  IPR: No  ? Long-Term Acute Care Hospital  LTACH: No  ? Acute Hospital Stay  Acute Hospital Stay: In the past  Was patient's stay within the last 30 days?: No    Talbert Forest  Nurse Case Manager  Cell: [phone]  Pager:[phone]

## 2018-11-20 NOTE — Progress Notes
Patient alert and oriented x4. Patient ID band verified with patient.  Profile updated, Fall risk in place, care plan/education updated,  call light within reach.

## 2018-11-20 NOTE — Progress Notes
RT Adult Assessment Note    NAME:Stephan LYDON VANSICKLE             MRN: [id number]             DOB:23-Dec-1934          AGE: 84 y.o.  ADMISSION DATE: 11/20/2018             DAYS ADMITTED: LOS: 0 days    RT Treatment Plan:       Protocol Plan: Procedures  PAP: Place a nursing order for "IS Q1h While Awake" for any of Lung Expansion indicators    Additional Comments:  Impressions of the patient: patient sitting up in bed nonlabored  Intervention(s)/outcome(s): incentive spirometry  Patient education that was completed: none  Recommendations to the care team: none    Vital Signs:  Pulse: 104  RR: 18 PER MINUTE  SpO2: 97 %  O2 Device:    Liter Flow:    O2%: 21 %  Breath Sounds:    Respiratory Effort: Non-Labored

## 2018-11-20 NOTE — H&P (View-Only)
ADMISSION HISTORY AND PHYSICAL      Name:  Tyler Castillo                                             MRN:  [id number]   Admission Date:  11/20/2018    ASSESSMENT & PLAN     84 y.o.   male past medical history hepatic mass, hyperlipidemia  who presents with  leukocytosis, lethargy, increasing hepatic mass size.    Liver abscess, recurrent  Leukocytosis  -history of liver abscess in 2019, status post drainage and 6 weeks ertapenem, 2 weeks Augmentin  -No clear etiology found. Gram stain of perihepatic fluid GPC resembling strep  -Asymptomatic in interim.   1wk lethargy, vague symptoms.  OSH CT interval increase in size.  Leukocytosis.  -AST/ALT/ALP elevation 300-400s, w/ mild elevation TBIli  -INR 1.5 - may represent coagulopathy  -s/p Zosyn and 1L fluids.   -Transferred for higher level of care  -On arrival VSS (slight tachycardia), 2/4 SIRS, pt NAD.  Benign abdominal exam.  Plan  >Ertapenem 1g q24h  >BCx, FungalCx  >557ml LR @ 17ml/hr  >Direct Bili  >Hepatology consult  >consider ID consult    Right UPJ Nephrolithiasis  -9mm stone noted in R UPJ  -alternating R and L back pain, described as sharp and above the hip  -pt unaware of h/o stones.    Plan  >IVF as above  >Urology consult  >NPO    Chest Pain  RBBB  -c/o lower chest/upper abdomen bilateral sharp pain in band-like manner   -trops negative EKG RBBB at OSH. No ekg at [place]  Plan  >EKG    FEN: 500LR, electrolytes pending, Diet NPO   Prophalyxis:  Heparin  Disposition: Admit to inpatient.    Code status: DNAR-FI (Discussed on 11/20/2018)      Patient to be discussed with on call staff in AM    Mayme Genta, MD       11/20/2018  Internal Medicine, PGY 2  Pager:  [phone]             _______________________________________________________________________________________________________________________________________________    Chief Complaint:  Lethargy    History of Present Illness Tyler Castillo is a 84 y.o. male with a past medical history of hepatic mass, hyperlipidemia who presented to OSH 11/19/2018 with a 6-day history of generalized malaise.  Patient states that 6 days prior he started not feeling himself, this progressed with feelings of unsteadiness, dizziness and he went to OSH ED on 7???01/2019 where he was diagnosed with dehydration and discharged home.  His symptoms progressed with accompanied  DOE and a band like upper abdominal chest pain, and alternating left/right flank pain.  Due to continued symptoms    , He presented to OSH ED on 11/19/2018.  Found to have leukocytosis 24, repeat CT abdomen pelvis with interval increase in hepatic abscess.   incidental finding of right nephrolithiasis and hydronephrosis.  CP symptoms cardiac work-up with RBBB,  troponins negative.    Patient received fluids, Zosyn and transferred to [place] for higher level of care.    Speaking with patient at  he is calm, NAD, only complaining of an itchy throat requesting analgesic.  He denies any abdominal pain, nausea, vomiting, diarrhea.  He does state that he has had no appetite for the past 2 days.  No changes in bowel habits.  No  unintentional weight loss.  Prior to this episode his energy was normal and he felt fully himself.  He denies fevers chills night sweats.      Review of Systems   A comprehensive 14-point ROS  was negative except for:      ROS  DOE, , chest pain, fatigue,     Past medical history   , Hepatic abscess, Hyperlipidemia    Past surgical history    has a past surgical history that includes appendectomy; Lung surgery; and Upper gastrointestinal endoscopy (N/A, 10/31/2017).       Family history     Family History   Problem Relation Age of Onset   ??? Stroke Mother        Social history     Social History     Socioeconomic History   ??? Marital status: Married     Spouse name: Not on file   ??? Number of children: Not on file   ??? Years of education: Not on file ??? Highest education level: Not on file   Occupational History   ??? Not on file   Tobacco Use   ??? Smoking status: Former Smoker     Last attempt to quit: 10/24/1980     Years since quitting: 38.0   ??? Smokeless tobacco: Never Used   Substance and Sexual Activity   ??? Alcohol use: Yes   ??? Drug use: Not on file   ??? Sexual activity: Not on file   Other Topics Concern   ??? Not on file   Social History Narrative   ??? Not on file          Allergies   Patient has no known allergies.    Medications     Current Facility-Administered Medications   Medication   ??? aspirin EC tablet 81 mg   ??? ertapenem Pincus Sanes) IVP 1 g   ??? heparin (porcine) PF syringe 5,000 Units          Physical exam     BP: 139/68 (07/13 0132)  Temp: 36.9 ???C (98.4 ???F) (07/13 0132)  Pulse: 102 (07/13 0132)  Respirations: 17 PER MINUTE (07/13 0132)  SpO2: 97 % (07/13 0132)  Height: 170.2 cm (67) (07/13 0132) BP: (139)/(68)   Temp:  [36.9 ???C (98.4 ???F)]   Pulse:  [102]   Respirations:  [17 PER MINUTE]   SpO2:  [97 %]   Vitals:    11/20/18 0132   Weight: 83.9 kg (185 lb)          General:  Alert and response to question appropriately.  VS as above. No acute distress .     Eyes: No conjunctival injection.  EOMI.   No scleral icterus.    Ears, nose, mouth, and throat : No erythema.   MMM.   No erythema, exudate noted.    Neck:  Symmetric appearance without crepitus, no obvious mass or noticeable swelling,   Lungs:  No use of accessory muscles.  Clear to auscultation bilaterally without wheezes, rales, or rhonchi.   Cardiovascular: Regular rate, S1, S2 normal, no murmurs, clicks, rubs, or gallops appreciated .  2+ and symmetric, all extremities.  No edema in BLE.   Abdomen:  BS+, soft, no guarding or rigidity.  Nontender to palpation without palpable masses.  No rebound tenderness.   Skin: Skin color normal, no obvious evidence of rashes.   Musculoskeletal:  Normal 5/5  hand-grip strength and  distal strength in BLE.  ROM normal. No joints swelling  or erythema. Neurologic:  Alerted and oriented .  5/5 hand-grip and distal strength. Normal ROM. Sensation grossly intact.  No focal weakness.    Psych:  Alerted and oriented, calm, normal affect          Labs        Admission labs pending  Radiology and Other Diagnostic Procedures Review     Pertinent radiology reviewed    Mayme Genta, MD       11/20/2018  Internal Medicine, PGY 2    Pager:  #[phone]/Available on [place]

## 2018-11-20 NOTE — Progress Notes
Pt called out saying he was having some chest pain, pointing in the epigastric region. Rated pain a 3/10 and states "it just aches." Med 3 notified. Will obtain a trop and EKG per Jimmye Norman, MD. Will continue to monitor.

## 2018-11-20 NOTE — Consults
Infectious Diseases Initial Consult    Today's Date:  11/20/2018  Admission Date: 11/20/2018    Reason for this consultation: Patient with history of hepatic mass of unclear etiology with prior cultures growing GPC resembling strep. Treated with ertapenem. Patient presenting with new similar appearing lesion. Please assist with antibiotic recommendations.    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 No viable tumor cells are present. ???The finding may represent reaction adjacent to mass lesion, or may represent a liver abscess.  -10/26/17 normal liver biopsy: Mild sinusoidal dilation and changes consistent with biliary obstruction. No significant fibrosis.   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 Review of outside CT by IR - felt significantly improved and drain could be removed   -11/21/17 IR drain removal. Abscessogram- no fistula  -12/01/17 finished ertapenem and started amox/clav, which he finished ~12/17/17  -12/22/17 another 14-day course of augmentin prescribed  -11/19/18 patient presented to [facility] ED with weakness, malaise, right-sided chest discomfort  -11/19/18 CT abdomen/pelvis: 7.2 x 8.9 cm heterogenous mass within hepatic dome posterior segment of R hepatic lobe; increased in size from prior study (July 19); interval development of 2.9x 3.0cm heterogenous mass within L hepatic lobe; interval development of mild (R) sided hydronephrosis secondary to 9mm calculus of right UPJ  -7/12 CXR: Cardiomegaly, chronic small right pleural effusion, right basilar atelectasis  -Leukocytosis and elevated liver enzymes  ???  Right hydronephrosis  Right UPJ nephrolithiasis  -11/19/2018 CT abdomen and pelvis: 9mm calculus of right UPJ    History of AKI- resolved    History of Viral pericarditis status-post pericardiectomy in 1993    Hyperlipidemia    Former tobacco use (quit 1978)    Recommendations:     1. Continue ertapenem for now  2. Obtain outside films to review  3. Please schedule the patient to go to IR for liver abscess aspiration/drainage/biopsy.  Send fluid/tissue for routine, anaerobic, fungal cultures  4. Follow-up blood cultures   5. The etiology of the initial abscess/recurrence remains unclear.  We should consider the possibility of biliary disease since the biopsy from last year showed some evidence of biliary obstruction.  Will discuss with Hepatology.  6. Monitor temp and WBC count    Thank you for the consultation. We will continue to co-manage the patient with you  __________________________________________________________________________________    History of Present Illness    Tyler Castillo is a 84 y.o. male with a history of hyperlipidemia, viral pericarditis s/p pericardiectomy in 1993, liver abscess, who was admitted today 7/13 with worsening right  hepatic lobe lesion.    The patient was admitted to [facility] from 6/16 to 11/03/2017 as a transfer from [facility] with right upper quadrant pain following a fall and contusion to right upper quadrant on 6/12. ???He was found to have a 7 x 9 cm mass in the right hepatic lobe concerning for malignancy.  He also had perihepatic fluid that tracked around the liver and into the peritoneal space.  This was confirmed by CT and then had an MRI on 6/18.  There was initial concern for malignancy.    On 10/26/2017, he underwent a liver mass biopsy which showed necrosis and mixed inflammatory infiltrates.  Negative stains for fungus, AFB and bacteria. Tumor markers including CA-19-9, CEA and AFP were negative. There was also aspiration of the fluid collection performed (6/19).  That revealed pus and a drain was placed.  Fluid was sent for culture.  Gram stain showed gram-positive cocci resembling strep. Cultures showed no growth after 5 days (was not cultured anaerobically) and negative for fungi and AFB to date.  Blood cultures that were obtained prior to this procedure on 10/24/2017 grew Fusobacterium nucleatum.  Follow-up blood cultures were negative on 6/22.  Panorex was done which was negative. He was seen by GI. They recommended repeat biopsy following completion of treatment for infection. Strongyloidiasis and amoeba antibodies were negative.  On 6/25, there was decreased output from the drain and he went back to IR for evaluation.  There was a persistent abscess cavity with migration of contrast medial into the larger more medial abscess pocket.  The drain was upsized.  Following finalization of his cultures he was placed on ertapenem.  He was discharged to skilled nursing facility.  ??????  His hospital course was also complicated by melena and anemia.  He underwent an EGD.  That showed grade D reflux esophagitis and nonbleeding erosive gastropathy.  He was started on PPI.  GI recommended repeat EGD in 12 weeks.  CT chest was done and showed few tiny left lung nodules there was recommendation to follow-up in 2 to 3 months.    Following discharge he had poor output from his drain.  A follow-up CT was done as an outpatient on 7/3 in [place]. IR felt that it had significantly improved.  The right hepatic mass was still present but it is seemed to be getting smaller.  There was a small amount of persistent perihepatic fluid collection.  Patient returned to [facility] on 11/21/17 for drain removal. He finished his course of ertapenem on 7/25, and had his PICC line removed on 7/30.    Patient was seen for follow-up by Dr. Nadara Eaton on 8/15 with repeat CT chest, abdomen, pelvis.  The CT chest showed stable lung nodules, stable extensive pleural calcifications and small right pleural effusion.  CT abdomen and pelvis showed improved liver abscess measuring 5.8 x 5.1 cm, no biliary ductal dilatation, bilateral nonobstructing nephrolithiasis.  Due to persistent lesion, the patient was prescribed a 14-day course of Augmentin.  ???  Yesterday 11/19/18, the patient presented to [facility] ED with weakness, malaise, and right-sided chest/flank pain.  He states he has had weakness and fatigue for 2 weeks.  The right flank pain also started around 2 weeks, while the right lower anterior chest pain started a few days before presentation.  His labs showed leukocytosis and elevated LFTs. CXR showed cardiomegaly, chronic small right pleural effusion, right basilar atelectasis CT abdomen/pelvis showed 7.2 x 8.9 cm heterogenous mass within the hepatic dome  posterior segment of R hepatic lobe; increased in size from prior study (July 2019); interval development of 2.9x 3.0cm heterogenous mass within L hepatic lobe; interval development of mild (R) sided hydronephrosis secondary to 9mm calculus of right UPJ.  The patient was given a dose of Zosyn and he was transferred to [facility] for further care.    The patient has been afebrile at [facility].  His UA showed mild pyuria.  Blood cultures were obtained. He was started on ertapenem.  The patient is doing relatively well.  He again reports fatigue and weakness.  No headache, dizziness, syncope, seizures, focal weakness, vision change, ENT symptoms.  He has dyspnea with exertion but no cough.  No nausea, vomiting, diarrhea, dysuria, hematuria, skin rash, joint pain, back pain.  ???    WBC 19.9  Hb 12.4  Platelets 164  Creatinine 1.26  AST 236  ALT 343  ALP 422  T bili 1.7    7/13 UA: 2-10 WBC, negative leukocytes, negative nitrite, 1+ blood, 2-10 RBC; UC pending  7/13 BC x1 set in process  7/13 fungal blood cultures x2 sets in process  ???  Antimicrobial Start date End date   Zosyn   6/17 x 1 dose; 6/19-6/25/19   11/19/2018 x1 dose ???   Ertapenem  6/25-7/25/19; 11/20/2018  active   Augmentin  7/25-8/10; 12/22/17 ???01/05/18   ??? ??? ???   ??? ??? ???   ??? ??? ???   ??? ??? ???   Estimated Creatinine Clearance: 47.6 mL/min (A) (based on SCr of 1.26 mg/dL (H)).    Past Medical History     Medical History:   Diagnosis Date   ??? Hyperlipidemia    ??? Renal failure        Past Surgical History     Surgical History:   Procedure Laterality Date   ??? ESOPHAGOGASTRODUODENOSCOPY WITH CONTROL OF BLEEDING - FLEXIBLE N/A 10/31/2017    Performed by Buckles, Vinnie Level, MD at [facility] ENDO   ??? HX APPENDECTOMY     ??? LUNG SURGERY         Social History   Marital status/area of residence: Lives in [place], [place] by himself.  His wife lives in a facility.  Job/occupation: Retired Lawyer  Travel history: [place] 9 years ago  Educational psychologist, bird exposures: No dogs at home but his children have 3 dogs  Recent ill contacts: None  TB exposure: None    Social History     Socioeconomic History   ??? Marital status: Married     Spouse name: Not on file   ??? Number of children: 1   ??? Years of education: Not on file   ??? Highest education level: Not on file   Occupational History   ??? Not on file   Tobacco Use   ??? Smoking status: Former Smoker     Packs/day: 0.50     Years: 20.00     Pack years: 10.00 Types: Cigarettes     Last attempt to quit: 10/24/1977     Years since quitting: 41.1   ??? Smokeless tobacco: Never Used   Substance and Sexual Activity   ??? Alcohol use: Yes   ??? Drug use: Never   ??? Sexual activity: Not on file   Other Topics Concern   ??? Not on file   Social History Narrative   ??? Not on file         Immunizations       There is no immunization history on  file for this patient.     Family History     Family History   Problem Relation Age of Onset   ??? Stroke Mother        Review of Systems   A comprehensive 14-point review of systems is negative except for what is reported in the HPI.    Medications   Scheduled Meds:ertapenem (INVanz) IVP 1 g, 1 g, Intravenous, Q24H*  heparin (porcine) PF syringe 5,000 Units, 5,000 Units, Subcutaneous, Q8H    Continuous Infusions:  PRN and Respiratory Meds:phenol PRN      Allergies   No Known Allergies      Physical Examination                          Vital Signs: Last                  Vital Signs: 24 Hour Range   BP: 140/63 (07/13 1656)  Temp: 36.4 ???C (97.6 ???F) (07/13 1656)  Pulse: 74 (07/13 1656)  Respirations: 18 PER MINUTE (07/13 1656)  SpO2: 98 % (07/13 1656)  Height: 170.2 cm (67) (07/13 0132) BP: (134-149)/(60-70)   Temp:  [36.3 ???C (97.3 ???F)-36.9 ???C (98.4 ???F)]   Pulse:  [74-104]   Respirations:  [16 PER MINUTE-18 PER MINUTE]   SpO2:  [97 %-98 %]      General appearance: Alert, oriented x 3, in NAD  HENT: Normocephalic, atraumatic, no sinus tenderness, oropharynx clear  Eyes: PERRL, EOM grossly intact, Conj nl  Neck: Supple, no lymphadenopathy  Lungs: Decreased breath sounds over the right lower lung with occasional fine crackles.crackles over the left base.  Otherwise CTAB, no wheezing, rhonchi  Heart: Regular rhythm, reg rate, no murmur, rub, gallop  Abdomen: Soft, non-tender, non-distended, normoactive bowel sounds, no hepatosplenomegaly, no masses  Back: No CVA tenderness bilaterally  Ext: No clubbing, cyanosis; mild bilateral lower extremity edema Skin: No rash  Lymph: No cervical, axillary or inguinal adenopathy    Lines: PIV      Lab Review   Hematology  Recent Labs     11/20/18  0345   WBC 19.9*   HGB 12.4*   HCT 35.9*   PLTCT 164   PTT 24.7   INR 1.4*     Chemistry  Recent Labs     11/20/18  0345   NA 132*   K 4.2   CL 99   CO2 24   BUN 32*   CR 1.26*   GFR 55*   GLU 91   CA 10.0   PO4 2.1   ALBUMIN 2.9*   ALKPHOS 422*   AST 236*   ALT 343*   TOTBILI 1.7*       Microbiology, Radiology and other Diagnostics Review   Microbiology data reviewed.    Pertinent radiology images viewed.      Theotis Burrow, MD  Division of Infectious Diseases   Pager [phone]

## 2018-11-20 NOTE — Consults
Gastroenterology Consult Note  Patient Name:Tyler Castillo         JXB:[id number]  Admission Date: 11/20/2018  1:16 AM      Principal Problem:    Liver mass  Active Problems:    Leucocytosis      History of Present Illness/Subjective:  CHARLTON HERZFELD is a 84 y.o. male with history of liver abscess versus infected mass, pericarditis s/p pericardectomy in 1993 presented to OSH 7/12 for 1 week of general fatigue, dizziness.  Hepatology was consulted when imaging showed increase in liver lesion previously consistent with abscess.    The patient had visited a outside hospital emergency room on 7/9 where he was treated supportively and released from the ER.  He now complains of worsening fatigue, intermittent shortness of breath and R flank pressure/discomfort at his belt line. He went back to the ER on 7/12 found to have leukocytosis, interval increase in hepatic lesion on CT.  Weight essentially stable over the past year. He was taking some Aleve twice daily at home.      Assessment/ Plan:    1. Fatigue, leukocytosis  2. Hepatic lesion (abscess vs. infected mass).  MRI 10/27/2017 segment five 9 cm mass.  Underwent percutaneous drainage, drain left in place.  Biopsy 10/26/2017 showed necrosis, mixed inflammatory infiltrate; blood cultures grew fusobacterium and strep (from fluid aspirate adjacent to mass); amebiasis negative.  Drain removed 11/21/2017, negative abscessogram.  Finished ertapenem 12/01/2017, Augmentin finish 12/17/2017.  CT 12/2017 showed decrease in size of abscess measuring 5.8 x 5.1 cm (previously 9.0 x 7.0 cm)  1. Liver Bx 10/26/17: The native liver biopsy shows mild sinusoidal dilation. ???The portal triads show mild neutrophilic inflammation and a bile ductular reaction, consistent with biliary outflow obstruction. ???These findings are often seen adjacent to a mass lesion. ???There is no evidence of chronic or acute hepatitis. ???There is no significant fibrosis.     Recommendations: Admitted with fatigue, dizziness and found to have leukocytosis with redemonstration of liver lesion previously consistent with abscess/necrosis on outside hospital CT.  Elevated aminotransaminases, acute kidney injury.  Right UPJ nephrolithiasis. Diverticulosis on CT.   - For definitive recommendations we will need to obtain the clouded images from OSH for review with our radiologists  - Urine analysis with culture, blood cultures (flank pain)   - ID consultation   - Check INR   - Stop use of NSAIDs at home    Patient seen/discussed with Dr. Livia Snellen Marthenia Rolling, MD  GI/Hepatology Fellow [phone]    -----------------------------  PMH:  Medical History:   Diagnosis Date   ??? Hyperlipidemia    ??? Renal failure        Current medications:  No current facility-administered medications on file prior to encounter.      Current Outpatient Medications on File Prior to Encounter   Medication Sig Dispense Refill   ??? amoxicillin/K clavulanate (AUGMENTIN) 875/125 mg tablet Take one tablet by mouth every 12 hours. Take with food. 28 tablet 0   ??? aspirin EC 81 mg tablet Take 81 mg by mouth daily. Take with food.     ??? pantoprazole DR (PROTONIX) 40 mg tablet Take one tablet by mouth twice daily. For 3 months per GI.     ??? traZODone (DESYREL) 100 mg tablet Take one tablet by mouth at bedtime as needed. 30 tablet 0   ??? vitamins, multiple cap Take 1 capsule by mouth daily.         PSH:  Surgical  History:   Procedure Laterality Date   ??? ESOPHAGOGASTRODUODENOSCOPY WITH CONTROL OF BLEEDING - FLEXIBLE N/A 10/31/2017    Performed by Buckles, Vinnie Level, MD at [facility] ENDO   ??? HX APPENDECTOMY     ??? LUNG SURGERY         SH:  Social History     Socioeconomic History   ??? Marital status: Married     Spouse name: Not on file   ??? Number of children: Not on file   ??? Years of education: Not on file   ??? Highest education level: Not on file   Occupational History   ??? Not on file   Tobacco Use   ??? Smoking status: Former Smoker Last attempt to quit: 10/24/1980     Years since quitting: 38.0   ??? Smokeless tobacco: Never Used   Substance and Sexual Activity   ??? Alcohol use: Yes   ??? Drug use: Not on file   ??? Sexual activity: Not on file   Other Topics Concern   ??? Not on file   Social History Narrative   ??? Not on file       FH:  Family History   Problem Relation Age of Onset   ??? Stroke Mother        Review of Systems:  Constitutional: No fevers, chills, weight loss  Eyes: No vision deficit, no icterus  Ears, nose, mouth: No oral bleeding, ulcer  Cardiovascular: No chest pain, palpitations  Respiratory: No dyspnea, cough   Gastrointestinal: No abdominal pain, blood in stool +flank pain  Musculoskeltal: No joint inflammation, new deformity  Integumentary: No rashes, exudate  Neurologic: No cognitive change, focal weakness  Hematologic: No for easy bleeding or bruising  Please see HPI for additional pertinent documentation    Physical Exam:  Vitals:    11/20/18 0132 11/20/18 0252   BP: 139/68    Pulse: 102 104   Temp: 36.9 ???C (98.4 ???F)    SpO2: 97% 97%   Weight: 83.9 kg (185 lb)    Height: 170.2 cm (67)      Constitutional- Vitals above, no acute distress.   Head - Normocephalic, atraumatic.   Eyes -  EOMI grossly. No icterus or injection.   Ears, nose, mouth, throat- No oral ulcer or bleeding.   Neck - No swelling or tracheal deviation.   Respiratory - Symmetric chest rise, no increased work of breathing.  Cardiovascular - Peripheral pulses intact, no pedal edema.  Gastrointestinal- Non-TTP. No hepatospenomegaly.   Skin - No exposed rash, lesion.  Neurologic - No CN deficit, normal fluid speech  Psychiatric - Judgement intact, thought content appropriate.    Labs/Imaging:  Pertient labs/imaging were reviewed on initiation of progress note.

## 2018-11-20 NOTE — Progress Notes
General Progress Note    Name:  [facility]   [id number] Date:  11/20/2018  Admission Date: 11/20/2018  LOS: 0 days                     Assessment/Plan:    Principal Problem:    Liver mass  Active Problems:    Leucocytosis    84 y.o.   male past medical history hepatic mass, hyperlipidemia  who presents with  leukocytosis, lethargy, increasing hepatic mass size.  ???  Liver abscess, recurrent  Leukocytosis  -history of liver abscess in 2019, status post drainage and 6 weeks ertapenem, 2 weeks Augmentin  -No clear etiology found. Gram stain of perihepatic fluid GPC resembling strep  -Asymptomatic in interim.   1wk lethargy, vague symptoms.  OSH CT interval increase in size.  Leukocytosis.  -AST/ALT/ALP elevation 300-400s, w/ mild elevation TBIli  -INR 1.5 - may represent coagulopathy  -s/p Zosyn and 1L fluids.   -Transferred for higher level of care  -On arrival VSS (slight tachycardia), 2/4 SIRS, pt NAD.  Benign abdominal exam.  Plan  >Ertapenem 1g q24h  >BCx, FungalCx  >569ml LR @ 150ml/hr  >Hepatology consult  > ID consult  ???  Right UPJ Nephrolithiasis  -9mm stone noted in R UPJ  -alternating R and L back pain, described as sharp and above the hip  -pt unaware of h/o stones.    Plan  >IVF as above  >NPO  > Per urology: -No acute surgical intervention required at this time  -Recommend UA with culture for sensitivities  -Recommend obtaining imaging while he is being treated (this may be done during monitoring of his liver abscess); please include the kidneys during this imaging to monitor for increase in hydronephrosis               > CT scan or Renal US   -The stone may be treated while he is inpatient following adequate management of his liver abscess or if he acutely decompensates with worsening renal function, urinary tract infection, or lack of improvement in his leukocytosis               > Urology will be available for treatment of his stone, if needed -Urology will follow-up on images, please contact with any questions or concerns    ???  Chest Pain  RBBB  -c/o lower chest/upper abdomen bilateral sharp pain in band-like manner   -trops negative EKG RBBB at OSH. No ekg at [place]  Plan  >EKG 7/13: RBBB, inferior infarct, age indeterminate  > f/u troponin  ???  FEN: 500LR, electrolytes pending, Diet NPO   Prophalyxis:  Heparin  Disposition: Admit to inpatient.    Code status: DNAR-FI (Discussed on 11/20/2018)        Patient seen & plan discussed with Dr. Lutricia Horsfall, MD  Anesthesiology, PGY-1  Available on [facility]  Pager [phone]    ________________________________________________________________________    Subjective  ZAHIR EISENHOUR is a 84 y.o. male with a past medical history of hyperlipidemia and hepatic mass. He went to the ED in [place] on 7/9 and was told that he was dehydrated. They gave him fluids and sent him home. He had malaise and was overall not feeling well on 7/12 so he went back to the ER where he got a CT scan and this hepatic abscess was noted as well as an incidental finding of nephrolithiasis and hydronephrosis and a leukocytosis of 24. He  states that he never had any abdominal pain or specific symptoms and that he just overall felt unwell. He is tired today due to lack of sleep last night. Patient endorses some diffuse chest pain that is episodic and squeezing. EKG and troponins negative for ACS in [place]. He also endorses bilateral flank pain that is stabbing in nature but not constant.     Patient reports decreased appetite, but denies fever, chills, diarrhea, constipation, dysuria, and abdominal pain.    Medications  Scheduled Meds:ertapenem (INVanz) IVP 1 g, 1 g, Intravenous, Q24H*  heparin (porcine) PF syringe 5,000 Units, 5,000 Units, Subcutaneous, Q8H    Continuous Infusions:  PRN and Respiratory Meds:phenol PRN      Review of Systems:  Review of Systems   Constitutional: Negative for chills and fever. Respiratory: Negative for shortness of breath.    Cardiovascular: Positive for chest pain. Negative for leg swelling.   Gastrointestinal: Negative for abdominal pain, constipation and diarrhea.   Genitourinary: Positive for flank pain. Negative for dysuria.   Musculoskeletal: Negative for myalgias.   Skin: Negative for rash.   Neurological: Negative for dizziness and headaches.   Psychiatric/Behavioral: Negative for depression.         Objective:                          Vital Signs: Last Filed                 Vital Signs: 24 Hour Range   BP: 143/60 (07/13 1225)  Temp: 36.4 ???C (97.6 ???F) (07/13 1225)  Pulse: 78 (07/13 1225)  Respirations: 18 PER MINUTE (07/13 1225)  SpO2: 98 % (07/13 1225)  Height: 170.2 cm (67) (07/13 0132) BP: (139-149)/(60-70)   Temp:  [36.4 ???C (97.6 ???F)-36.9 ???C (98.4 ???F)]   Pulse:  [78-104]   Respirations:  [16 PER MINUTE-18 PER MINUTE]   SpO2:  [97 %-98 %]      Vitals:    11/20/18 0132   Weight: 83.9 kg (185 lb)       Intake/Output Summary:  (Last 24 hours)    Intake/Output Summary (Last 24 hours) at 11/20/2018 1401  Last data filed at 11/20/2018 1226  Gross per 24 hour   Intake 0 ml   Output 200 ml   Net -200 ml      Stool Occurrence: 0    Physical Exam  Physical Exam   Constitutional: He is oriented to person, place, and time and well-developed, well-nourished, and in no distress.   HENT:   Head: Normocephalic and atraumatic.   Eyes: No scleral icterus.   Neck: Normal range of motion.   Cardiovascular: Normal rate and regular rhythm.   Pulmonary/Chest: Effort normal and breath sounds normal.   Abdominal: Soft. Bowel sounds are normal.   Musculoskeletal:         General: No edema.   Neurological: He is alert and oriented to person, place, and time.   Skin: Skin is warm and dry.       Lab Review  24-hour labs:    Results for orders placed or performed during the hospital encounter of 11/20/18 (from the past 24 hour(s))   CBC AND DIFF    Collection Time: 11/20/18  3:45 AM Result Value Ref Range    White Blood Cells 19.9 (H) 4.5 - 11.0 K/UL    RBC 3.68 (L) 4.4 - 5.5 M/UL    Hemoglobin 12.4 (L) 13.5 - 16.5 GM/DL  Hematocrit 35.9 (L) 40 - 50 %    MCV 97.5 80 - 100 FL    MCH 33.7 26 - 34 PG    MCHC 34.5 32.0 - 36.0 G/DL    RDW 16.1 11 - 15 %    Platelet Count 164 150 - 400 K/UL    MPV 10.2 7 - 11 FL    Segmented Neutrophils 66 41 - 77 %    Bands 18 (H) 0 - 10 %    Lymphocytes 1 (L) 24 - 44 %    Monocytes 15 (H) 4 - 12 %    ANISO PRESENT     Platelet Estimate NORMAL     Absolute Neutrophil Count Manual 16.71 (H) 1.8 - 7.0 K/UL   PROTIME INR (PT)    Collection Time: 11/20/18  3:45 AM   Result Value Ref Range    INR 1.4 (H) 0.8 - 1.2   PTT (APTT)    Collection Time: 11/20/18  3:45 AM   Result Value Ref Range    APTT 24.7 24.0 - 36.5 SEC   COMPREHENSIVE METABOLIC PANEL    Collection Time: 11/20/18  3:45 AM   Result Value Ref Range    Sodium 132 (L) 137 - 147 MMOL/L    Potassium 4.2 3.5 - 5.1 MMOL/L    Chloride 99 98 - 110 MMOL/L    Glucose 91 70 - 100 MG/DL    Blood Urea Nitrogen 32 (H) 7 - 25 MG/DL    Creatinine 0.96 (H) 0.4 - 1.24 MG/DL    Calcium 04.5 8.5 - 40.9 MG/DL    Total Protein 6.1 6.0 - 8.0 G/DL    Total Bilirubin 1.7 (H) 0.3 - 1.2 MG/DL    Albumin 2.9 (L) 3.5 - 5.0 G/DL    Alk Phosphatase 811 (H) 25 - 110 U/L    AST (SGOT) 236 (H) 7 - 40 U/L    CO2 24 21 - 30 MMOL/L    ALT (SGPT) 343 (H) 7 - 56 U/L    Anion Gap 9 3 - 12    eGFR Non African American 55 (L) >60 mL/min    eGFR African American >60 >60 mL/min   MAGNESIUM    Collection Time: 11/20/18  3:45 AM   Result Value Ref Range    Magnesium 1.9 1.6 - 2.6 mg/dL   PHOSPHORUS    Collection Time: 11/20/18  3:45 AM   Result Value Ref Range    Phosphorus 2.1 2.0 - 4.5 MG/DL   BILIRUBIN, DIRECT    Collection Time: 11/20/18  3:45 AM   Result Value Ref Range    Bilirubin, Direct 1.1 (H) <0.4 MG/DL   URINALYSIS DIPSTICK    Collection Time: 11/20/18 11:30 AM   Result Value Ref Range    Color,UA AMBER     Turbidity,UA CLEAR CLEAR-CLEAR Specific Gravity-Urine 1.039 (H) 1.003 - 1.035    pH,UA 5.0 5.0 - 8.0    Protein,UA 2+ (A) NEG-NEG    Glucose,UA NEG NEG-NEG    Ketones,UA NEG NEG-NEG    Bilirubin,UA NEG NEG-NEG    Blood,UA 1+ (A) NEG-NEG    Urobilinogen,UA NORMAL NORM-NORMAL    Nitrite,UA NEG NEG-NEG    Leukocytes,UA NEG NEG-NEG    Urine Ascorbic Acid, UA POS (A) NEG-NEG   URINALYSIS, MICROSCOPIC    Collection Time: 11/20/18 11:30 AM   Result Value Ref Range    WBCs,UA 2-10 0 - 2 /HPF    RBCs,UA 2-10 0 - 3 /HPF  MucousUA TRACE        Point of Care Testing  (Last 24 hours)  Glucose: 91 (11/20/18 0345)    Radiology and other Diagnostics Review:    CT Chest, Abd, Pelvis 7/13    1. ???Unchanged left lung nodules. No new or enlarging nodules identified.    2. ???Stable small right pleural effusion and extensive pleural   calcifications.    ABDOMEN AND PELVIS:    1. ???Decrease in size of hepatic abscess.    2. ???Bilateral nonobstructing nephrolithiasis. ???      Howard Pouch, MD   Pager

## 2018-11-20 NOTE — Progress Notes
Patient arrived to room # [phone]) via EMS accompanied by EMS. Patient transferred to the bed with assistance. Bedside safety checks completed. Initial patient assessment completed. Refer to flowsheet for details.    Admission skin assessment completed with: Norvel Richards, RN     Pressure injury present on arrival?: No    1. Head/Face/Neck: No  2. Trunk/Back: No  3. Upper Extremities: No  4. Lower Extremities: No  5. Pelvic/Coccyx: No  6. Assessed for device associated injury? Yes  7. Malnutrition Screening Tool (Nursing Nutrition Assessment) Completed? Yes    See Doc Flowsheet for additional wound details.

## 2018-11-20 NOTE — Consults
Urology Consult Note  11/20/2018       Patient: Tyler Castillo  MRN: [id number]    Admission Date:  11/20/2018, LOS: 0 days  Admission Diagnosis: Liver mass [R16.0]  Date of Service: November 20, 2018    Reason for Consult: Loetta Rough  Referring Provider: Alfredia Client, MD  Attending Surgeon: Dr. Rulon Eisenmenger   Consult Performed by: Conrad Burlington, MD    ASSESSMENT: 84 y.o. male with recurrent hepatic abscess and right 9 mm UPJ stone with hydronephrosis.  Upon review, he has had the presence of this right UPJ stone for over 1 year.  CT scan from the outside facility does not generate a clear picture on Rad PACS and therefore it is difficult to view the degree of hydronephrosis that is present.  He denies any flank pain or urinary symptoms indicating that his leukocytosis is unlikely that of urinary origin and more likely due to his hepatic abscess.    PLAN:    -No acute surgical intervention required at this time  -Recommend UA with culture for sensitivities  -Recommend obtaining imaging while he is being treated (this may be done during monitoring of his liver abscess); please include the kidneys during this imaging to monitor for increase in hydronephrosis   > CT scan or Renal US   -The stone may be treated while he is inpatient following adequate management of his liver abscess or if he acutely decompensates with worsening renal function, urinary tract infection, or lack of improvement in his leukocytosis   > Urology will be available for treatment of his stone, if needed  -Urology will follow-up on images, please contact with any questions or concerns    Discussed with Dr. Jonelle Sports. Thank you for consulting Urology.     Conrad Burlington, MD  Urology PGY-2  Please page Urology on call with any questions  __________________________________________________________________________________    HPI: Tyler Castillo is a 84 y.o. male with a past medical history of hepatic abscess, pericardial effusion s/p aspiration, hyperlipidemia and right UPJ nephrolithiasis.  He initially presented to an outside hospital on 11/19/2018 due to lethargy and dizziness and found to have a interval increase in hepatic abscesses, nephrolithiasis with hydronephrosis.  He was transferred to [facility] for further management.    Upon review of his records he had the presence of a right UPJ stone on his last CT on 12/22/2017.  This stone has not bothered him and he was unaware that he currently had it.  He has undergone ureteroscopy for nephrolithiasis in 2015.  He does have intermittent bilateral flank pain that is not very bothersome.  He denies urinary symptoms including dysuria, frequency, urgency or hematuria.    He has had recurrent liver abscesses status post drainage with IV antibiotics in 2019.  He currently has a leukocytosis 19.9, Cr 1.126 (baseline ~0.9), elevated AST/AST/ALP and mild elevation T bili.    Medical History:   Diagnosis Date   ??? Hyperlipidemia    ??? Renal failure        Surgical History:   Procedure Laterality Date   ??? ESOPHAGOGASTRODUODENOSCOPY WITH CONTROL OF BLEEDING - FLEXIBLE N/A 10/31/2017    Performed by Buckles, Vinnie Level, MD at [facility] ENDO   ??? HX APPENDECTOMY     ??? LUNG SURGERY         Medications:  Scheduled Meds:ertapenem (INVanz) IVP 1 g, 1 g, Intravenous, Q24H*  heparin (porcine) PF syringe 5,000 Units, 5,000 Units, Subcutaneous, Q8H    Continuous Infusions:  PRN and Respiratory Meds:phenol PRN      Allergies:  Patient has no known allergies.    Social History     Socioeconomic History   ??? Marital status: Married     Spouse name: Not on file   ??? Number of children: Not on file   ??? Years of education: Not on file   ??? Highest education level: Not on file   Occupational History   ??? Not on file   Tobacco Use   ??? Smoking status: Former Smoker     Last attempt to quit: 10/24/1980     Years since quitting: 38.0   ??? Smokeless tobacco: Never Used   Substance and Sexual Activity ??? Alcohol use: Yes   ??? Drug use: Not on file   ??? Sexual activity: Not on file   Other Topics Concern   ??? Not on file   Social History Narrative   ??? Not on file       Family History   Problem Relation Age of Onset   ??? Stroke Mother        Vitals:  Vital Signs: Last Filed In 24 Hours Vital Signs: 24 Hour Range   BP: 149/70 (07/13 0724)  Temp: 36.5 ???C (97.7 ???F) (07/13 1610)  Pulse: 87 (07/13 0724)  Respirations: 16 PER MINUTE (07/13 0724)  SpO2: 97 % (07/13 0724)  Height: 170.2 cm (67) (07/13 0132) BP: (139-149)/(68-70)   Temp:  [36.5 ???C (97.7 ???F)-36.9 ???C (98.4 ???F)]   Pulse:  [87-104]   Respirations:  [16 PER MINUTE-18 PER MINUTE]   SpO2:  [97 %]           Intake/Output:    Intake/Output Summary (Last 24 hours) at 11/20/2018 0814  Last data filed at 11/20/2018 0724  Gross per 24 hour   Intake 0 ml   Output 0 ml   Net 0 ml       Physical Exam:   General: Alert, oriented, cooperative, no distress  Head: Normocephalic, without obvious abnormality, atraumatic  Eyes: EOMI, no scleral icterus  Lungs: Unlabored on RA, symmetric chest rise  Heart: Regular rate  Abdomen: Soft, non-tender, non-distended  GU: No CVA tenderness, orthotopic and patent meatus, testicles symmetric and non-tender  Extremity: No appreciable edema  Neurologic: Grossly intact.     ROS:  A complete review of systems was obtained and was negative except for the symptoms reviewed above.     Lab/Radiology/Other Diagnostic Tests:  Recent Labs     11/20/18  0345   HGB 12.4*   HCT 35.9*   WBC 19.9*   PLTCT 164   NA 132*   K 4.2   CL 99   CO2 24   BUN 32*   CR 1.26*   GLU 91   CA 10.0   MG 1.9   PO4 2.1   ALBUMIN 2.9*   TOTPROT 6.1   TOTBILI 1.7*   AST 236*   ALT 343*   ALKPHOS 422*   INR 1.4*   PTT 24.7     Glucose: 91 (11/20/18 0345)    Principal Problem:    Liver mass  Active Problems:    Leucocytosis

## 2018-11-21 ENCOUNTER — Encounter: Admit: 2018-11-21 | Discharge: 2018-11-21

## 2018-11-21 LAB — URINALYSIS DIPSTICK REFLEX TO CULTURE
Lab: 5 /HPF (ref 5.0–8.0)
Lab: NEGATIVE
Lab: NEGATIVE
Lab: NEGATIVE

## 2018-11-21 LAB — URINALYSIS MICROSCOPIC REFLEX TO CULTURE

## 2018-11-21 LAB — COMPREHENSIVE METABOLIC PANEL
Lab: 134 MMOL/L — ABNORMAL LOW (ref >60)
Lab: 24 MMOL/L — ABNORMAL LOW (ref 21–30)
Lab: 99 MMOL/L — ABNORMAL LOW (ref >60)

## 2018-11-21 LAB — PROTIME INR (PT): Lab: 1.4 g/dL — ABNORMAL HIGH (ref 0.8–1.2)

## 2018-11-21 MED ORDER — IOHEXOL 350 MG IODINE/ML IV SOLN
100 mL | Freq: Once | INTRAVENOUS | 0 refills | Status: CP
Start: 2018-11-21 — End: ?
  Administered 2018-11-22: 100 mL via INTRAVENOUS

## 2018-11-21 MED ORDER — POLYETHYLENE GLYCOL 3350 17 GRAM PO PWPK
1 | Freq: Every day | ORAL | 0 refills | Status: DC
Start: 2018-11-21 — End: 2018-11-29
  Administered 2018-11-21 – 2018-11-22 (×2): 17 g via ORAL

## 2018-11-21 MED ORDER — LORAZEPAM 2 MG/ML IJ SOLN
.5 mg | Freq: Once | INTRAVENOUS | 0 refills | Status: CP
Start: 2018-11-21 — End: ?
  Administered 2018-11-21: 0.5 mg via INTRAVENOUS

## 2018-11-21 MED ORDER — PROCHLORPERAZINE EDISYLATE 5 MG/ML IJ SOLN
10 mg | INTRAVENOUS | 0 refills | Status: DC | PRN
Start: 2018-11-21 — End: 2018-11-28
  Administered 2018-11-21 – 2018-11-25 (×2): 10 mg via INTRAVENOUS

## 2018-11-21 MED ORDER — PHYTONADIONE IVPB
10 mg | Freq: Every day | INTRAVENOUS | 0 refills | Status: CP
Start: 2018-11-21 — End: ?
  Administered 2018-11-21 – 2018-11-23 (×6): 10 mg via INTRAVENOUS

## 2018-11-21 MED ORDER — LACTATED RINGERS IV SOLP
500 mL | INTRAVENOUS | 0 refills | Status: AC
Start: 2018-11-21 — End: ?
  Administered 2018-11-22: 01:00:00 500 mL via INTRAVENOUS

## 2018-11-21 MED ORDER — HEPARIN, PORCINE (PF) 5,000 UNIT/0.5 ML IJ SYRG
5000 [IU] | SUBCUTANEOUS | 0 refills | Status: AC
Start: 2018-11-21 — End: ?

## 2018-11-21 MED ORDER — SENNOSIDES-DOCUSATE SODIUM 8.6-50 MG PO TAB
1 | Freq: Two times a day (BID) | ORAL | 0 refills | Status: DC
Start: 2018-11-21 — End: 2018-12-01
  Administered 2018-11-21 – 2018-11-29 (×9): 1 via ORAL

## 2018-11-21 MED ORDER — MELATONIN 3 MG PO TAB
3 mg | Freq: Once | ORAL | 0 refills | Status: CP
Start: 2018-11-21 — End: ?
  Administered 2018-11-21: 06:00:00 3 mg via ORAL

## 2018-11-21 MED ORDER — SODIUM CHLORIDE 0.9 % IJ SOLN
50 mL | Freq: Once | INTRAVENOUS | 0 refills | Status: CP
Start: 2018-11-21 — End: ?
  Administered 2018-11-22: 50 mL via INTRAVENOUS

## 2018-11-21 MED ADMIN — WATER FOR INJECTION, STERILE IJ SOLN [79513]: 20 mL | INTRAVENOUS | @ 14:00:00 | Stop: 2018-11-21 | NDC 00409488723

## 2018-11-21 NOTE — Med Student Progress Note
Medical Student Progress Note -  Inpatient    NAME: Tyler Castillo                                     MRN: [id number]                 DOB: November 01, 1934          AGE: 84 y.o.  ADMISSION DATE: 11/20/2018             DAYS ADMITTED: LOS: 1 day      Subjective: Tyler Castillo is a 84 year old male with a PMH of HLD and right hepatic mass that was admitted 1 day ago for malaise, leukocytosis, worsening right/new left hepatic masses, and nephrolithiasis/hydronephrosis. Overnight, the patient does not report any acute events. Today, the patient states that they are continuing to have some LBP but the lidocaine patches are seeming to help. The patient is feeling well otherwise and denies any new or concerning symptoms.     Scheduled Meds:ertapenem (INVanz) IVP 1 g, 1 g, Intravenous, Q24H*  heparin (porcine) PF syringe 5,000 Units, 5,000 Units, Subcutaneous, Q8H  lidocaine (LIDODERM) 5 % topical patch 1 patch, 1 patch, Topical, QDAY    And  Verification of Patch Placement and Integrity - Lidocaine 5%, , Transdermal, BID    Continuous Infusions:  PRN and Respiratory Meds:acetaminophen/lidocaine/antacid DS(#) Q4H PRN, phenol PRN    Review of Systems   Constitutional: Negative for chills and fever.   Respiratory: Negative for shortness of breath and wheezing.    Cardiovascular: Negative for chest pain and palpitations.   Gastrointestinal: Negative for abdominal pain, constipation, diarrhea, nausea and vomiting.   Musculoskeletal: Positive for back pain.       Objective:  Physical Exam   Constitutional: He is well-developed, well-nourished, and in no distress.   Cardiovascular: Normal rate, regular rhythm and normal heart sounds.   No murmur heard.  Pulmonary/Chest: Effort normal and breath sounds normal. No respiratory distress. He has no wheezes.   Abdominal: Soft. Bowel sounds are normal. He exhibits no distension. There is no abdominal tenderness. Results for orders placed or performed during the hospital encounter of 11/20/18 (from the past 48 hour(s))   CBC AND DIFF    Collection Time: 11/20/18  3:45 AM   # # Low-High    White Blood Cells 19.9 (H) 4.5 - 11.0 K/UL    RBC 3.68 (L) 4.4 - 5.5 M/UL    Hemoglobin 12.4 (L) 13.5 - 16.5 GM/DL    Hematocrit 03.4 (L) 40 - 50 %    MCV 97.5 80 - 100 FL    MCH 33.7 26 - 34 PG    MCHC 34.5 32.0 - 36.0 G/DL    RDW 74.2 11 - 15 %    Platelet Count 164 150 - 400 K/UL    MPV 10.2 7 - 11 FL    Segmented Neutrophils 66 41 - 77 %    Bands 18 (H) 0 - 10 %    Lymphocytes 1 (L) 24 - 44 %    Monocytes 15 (H) 4 - 12 %    ANISO PRESENT     Platelet Estimate NORMAL     Absolute Neutrophil Count Manual 16.71 (H) 1.8 - 7.0 K/UL   PROTIME INR (PT)    Collection Time: 11/20/18  3:45 AM   # # Low-High    INR 1.4 (H)  0.8 - 1.2   PTT (APTT)    Collection Time: 11/20/18  3:45 AM   # # Low-High    APTT 24.7 24.0 - 36.5 SEC   COMPREHENSIVE METABOLIC PANEL    Collection Time: 11/20/18  3:45 AM   # # Low-High    Sodium 132 (L) 137 - 147 MMOL/L    Potassium 4.2 3.5 - 5.1 MMOL/L    Chloride 99 98 - 110 MMOL/L    Glucose 91 70 - 100 MG/DL    Blood Urea Nitrogen 32 (H) 7 - 25 MG/DL    Creatinine 4.54 (H) 0.4 - 1.24 MG/DL    Calcium 09.8 8.5 - 11.9 MG/DL    Total Protein 6.1 6.0 - 8.0 G/DL    Total Bilirubin 1.7 (H) 0.3 - 1.2 MG/DL    Albumin 2.9 (L) 3.5 - 5.0 G/DL    Alk Phosphatase 147 (H) 25 - 110 U/L    AST (SGOT) 236 (H) 7 - 40 U/L    CO2 24 21 - 30 MMOL/L    ALT (SGPT) 343 (H) 7 - 56 U/L    Anion Gap 9 3 - 12    eGFR Non African American 55 (L) >60 mL/min    eGFR African American >60 >60 mL/min   MAGNESIUM    Collection Time: 11/20/18  3:45 AM   # # Low-High    Magnesium 1.9 1.6 - 2.6 mg/dL   PHOSPHORUS    Collection Time: 11/20/18  3:45 AM   # # Low-High    Phosphorus 2.1 2.0 - 4.5 MG/DL   CULTURE-BLOOD W/SENSITIVITY    Collection Time: 11/20/18  3:45 AM   # # Low-High    Battery Name BLOOD CULTURE     Specimen Description BLOOD  LEFT  WRIST Special Requests NONE     Culture NO GROWTH 1 DAY     Report Status     COVID-19 (SARS-COV-2) PCR    Collection Time: 11/20/18  3:45 AM   # # Low-High    COVID-19 (SARS-CoV-2) PCR Source NASOPHARYNGEAL SWAB     COVID-19 (SARS-CoV-2) PCR NOT DETECTED DN-NOT DETECTED   BILIRUBIN, DIRECT    Collection Time: 11/20/18  3:45 AM   # # Low-High    Bilirubin, Direct 1.1 (H) <0.4 MG/DL   CULTURE-BLOOD W/SENSITIVITY    Collection Time: 11/20/18  6:00 AM   # # Low-High    Battery Name BLOOD CULTURE     Specimen Description BLOOD  LEFT  HAND       Special Requests NONE     Culture NO GROWTH 1 DAY     Report Status     URINALYSIS DIPSTICK    Collection Time: 11/20/18 11:30 AM   # # Low-High    Color,UA AMBER     Turbidity,UA CLEAR CLEAR-CLEAR    Specific Gravity-Urine 1.039 (H) 1.003 - 1.035    pH,UA 5.0 5.0 - 8.0    Protein,UA 2+ (A) NEG-NEG    Glucose,UA NEG NEG-NEG    Ketones,UA NEG NEG-NEG    Bilirubin,UA NEG NEG-NEG    Blood,UA 1+ (A) NEG-NEG    Urobilinogen,UA NORMAL NORM-NORMAL    Nitrite,UA NEG NEG-NEG    Leukocytes,UA NEG NEG-NEG    Urine Ascorbic Acid, UA POS (A) NEG-NEG   URINALYSIS, MICROSCOPIC    Collection Time: 11/20/18 11:30 AM   # # Low-High    WBCs,UA 2-10 0 - 2 /HPF    RBCs,UA 2-10 0 - 3 /HPF  MucousUA TRACE    TROPONIN-I    Collection Time: 11/20/18  5:10 PM   # # Low-High    Troponin-I 0.03 0.0 - 0.05 NG/ML   URINALYSIS DIPSTICK REFLEX TO CULTURE    Collection Time: 11/20/18  7:17 PM   # # Low-High    Color,UA AMBER     Turbidity,UA CLEAR CLEAR-CLEAR    Specific Gravity-Urine 1.033 1.003 - 1.035    pH,UA 5.0 5.0 - 8.0    Protein,UA 2+ (A) NEG-NEG    Glucose,UA NEG NEG-NEG    Ketones,UA NEG NEG-NEG    Bilirubin,UA NEG NEG-NEG    Blood,UA 1+ (A) NEG-NEG    Urobilinogen,UA NORMAL NORM-NORMAL    Nitrite,UA NEG NEG-NEG    Leukocytes,UA NEG NEG-NEG    Urine Ascorbic Acid, UA POS (A) NEG-NEG   URINALYSIS MICROSCOPIC REFLEX TO CULTURE    Collection Time: 11/20/18  7:17 PM   # # Low-High WBCs,UA 2-10 0 - 2 /HPF    RBCs,UA 2-10 0 - 3 /HPF    Comment,UA       Criteria for reflex to culture are WBC>10, Positive Nitrite, and/or >=+1   leukocytes. If quantity is not sufficient, an addendum will follow.      MucousUA TRACE    CBC AND DIFF    Collection Time: 11/21/18  4:24 AM   # # Low-High    White Blood Cells 17.8 (H) 4.5 - 11.0 K/UL    RBC 3.79 (L) 4.4 - 5.5 M/UL    Hemoglobin 12.8 (L) 13.5 - 16.5 GM/DL    Hematocrit 16.1 (L) 40 - 50 %    MCV 97.6 80 - 100 FL    MCH 33.7 26 - 34 PG    MCHC 34.5 32.0 - 36.0 G/DL    RDW 09.6 11 - 15 %    Platelet Count 183 150 - 400 K/UL    MPV 9.6 7 - 11 FL    Neutrophils 87 (H) 41 - 77 %    Lymphocytes 5 (L) 24 - 44 %    Monocytes 8 4 - 12 %    Eosinophils 0 0 - 5 %    Basophils 0 0 - 2 %    Absolute Neutrophil Count 15.46 (H) 1.8 - 7.0 K/UL    Absolute Lymph Count 0.93 (L) 1.0 - 4.8 K/UL    Absolute Monocyte Count 1.41 (H) 0 - 0.80 K/UL    Absolute Eosinophil Count 0.01 0 - 0.45 K/UL    Absolute Basophil Count 0.03 0 - 0.20 K/UL   COMPREHENSIVE METABOLIC PANEL    Collection Time: 11/21/18  4:24 AM   # # Low-High    Sodium 134 (L) 137 - 147 MMOL/L    Potassium 4.1 3.5 - 5.1 MMOL/L    Chloride 99 98 - 110 MMOL/L    Glucose 108 (H) 70 - 100 MG/DL    Blood Urea Nitrogen 42 (H) 7 - 25 MG/DL    Creatinine 0.45 0.4 - 1.24 MG/DL    Calcium 9.5 8.5 - 40.9 MG/DL    Total Protein 6.2 6.0 - 8.0 G/DL    Total Bilirubin 2.1 (H) 0.3 - 1.2 MG/DL    Albumin 3.0 (L) 3.5 - 5.0 G/DL    Alk Phosphatase 811 (H) 25 - 110 U/L    AST (SGOT) 169 (H) 7 - 40 U/L    CO2 24 21 - 30 MMOL/L    ALT (SGPT) 294 (H) 7 - 56 U/L  Anion Gap 11 3 - 12    eGFR Non African American 58 (L) >60 mL/min    eGFR African American >60 >60 mL/min   PROTIME INR (PT)    Collection Time: 11/21/18  4:24 AM   # # Low-High    INR 1.4 (H) 0.8 - 1.2       Assessment/Plan: Mr. Preble is a 84 year old male with a PMH of HLD and right hepatic mass that was admitted 1 day ago for malaise, leukocytosis, worsening right/new left hepatic masses, and nephrolithiasis/hydronephrosis.     Right and Left Hepatic Masses  - History of right hepatic mass since 2019  - Interval enlargement of right hepatic mass  - Left hepatic mass found on CT (11/19/18)  - Leukocytosis on admission (11/19/18)   - Continue Ertapenem IV 1g   - IR Consult for Hepatic Abscess I & D with biopsy    Nephrolithiasis  Hydronephrosis   - 9 mm right UDJ renal calculus with hydronephrosis on CT (11/19/18)  - Urology recommendation of non-surgical management (11/20/18)   - Continue to monitor renal function     Back Pain   - Continue Lidocaine patch PRN    Gastric Reflux  - Epigastric/chest pain consistent with gastric reflux (11/20/18)   - Continue GI Cocktail Q4H PRN    Insomnia   - Start Trazodone 100 mg QPM    Rehabilitation    - PT Consult

## 2018-11-21 NOTE — Care Plan
Problem: Infection, Risk of  Goal: Absence of infection  Outcome: Goal Ongoing  Flowsheets (Taken 11/20/2018 0523 by Juliane Poot, RN)  Absence of infection:   Assess for infection (Monitor SIRS Criteria)   Monitor for signs and symptoms of infection  Goal: Knowledge of Infection Control Procedures  Outcome: Goal Achieved  Flowsheets (Taken 11/21/2018 1300)  Knowledge of Infection Control procedures: Provide Isolation Precautions Education     Problem: Falls, High Risk of  Goal: Absence of falls-Adult Patient  Outcome: Goal Ongoing  Flowsheets (Taken 11/20/2018 0523 by Juliane Poot, RN)  Absence of falls-Adult Patient:   Complete Fall Risk Assessment.   Provde safe environment.   Consider additional interventions if patient is confused, has gait/balance problems and on high risk medications.   Provide safe ambulation.   Implement fall risk bundle.   Provide fall prevention strategies.     Problem: Discharge Planning  Goal: Participation in plan of care  Outcome: Goal Ongoing  Flowsheets (Taken 11/20/2018 0523 by Juliane Poot, RN)  Participation in Plan of Care: Involve patient/caregiver in care planning decision making  Goal: Knowledge regarding plan of care  Outcome: Goal Ongoing  Flowsheets (Taken 11/20/2018 0523 by Juliane Poot, RN)  Knowledge regarding plan of care:   Provide admission education to parent/caregiver   Provide plan of care education   Provide procedural and treatment education   Provide fall prevention education   Provide infection prevention education   Provide medication management education   Provide VTE signs and symptoms education   Provide pre-operative teaching  Goal: Prepared for discharge  Outcome: Goal Ongoing  Flowsheets (Taken 11/20/2018 0523 by Juliane Poot, RN)  Prepared for discharge:   Complete ADL ability assessment   Provide safe use medical equipment education   Provide discharge activity restrictions education   Provide discharge materials appropriate to patient condition Provide diet and oral health education   Collaborate with multidisciplinary team for hospital discharge coordination     Problem: Pain  Goal: Management of pain  Outcome: Goal Ongoing  Flowsheets (Taken 11/20/2018 0523 by Juliane Poot, RN)  Management of pain:   In the patient who can fully report pain, assess pain characteristics.   In the patient who cannot fully report, observational (behavior) tools are appropriate.   Manage pain.   Assess opioid analgesia side-effects.   Assess pain control barriers.   Complete pain assessment scale according to age, condition and ability to understand.  Goal: Knowledge of pain management  Outcome: Goal Ongoing  Flowsheets (Taken 11/20/2018 0523 by Juliane Poot, RN)  Knowledge of pain management:   Provide pain scale education   Provide pain management methods education   Provide pharmacological pain management education  Goal: Progress Toward Pain Management Goals  Outcome: Goal Ongoing  Flowsheets (Taken 11/20/2018 0523 by Juliane Poot, RN)  Progress toward pain management goals:   Progress toward pain management goals   Assess progress toward pain management goals

## 2018-11-21 NOTE — Progress Notes
Pt reporting "indigestion" type abdominal pain, RN walked into room to give GI cocktail and patient had two occurrences of emesis, roughly about 269mL in total. The color was brown, but not coffee ground in texture. RN sent a voalte message to Dr. Jimmye Norman who placed an order for compazine. RN administered per [facility]. Pt reported to Dr. Jimmye Norman that he is claustrophobic with scans, so he placed an order for Ativan to be given right before CT scan, and fluids after CT scan. Will notify following RN if not completed on this shift.

## 2018-11-21 NOTE — Progress Notes
General Progress Note    Name:  Tyler Castillo   BJYNW'G Date:  11/21/2018  Admission Date: 11/20/2018  LOS: 1 day                     Assessment/Plan:    Principal Problem:    Liver mass  Active Problems:    Leucocytosis    83 y.o.?????????male???past medical history hepatic mass, hyperlipidemia ???who presents with ???leukocytosis, lethargy, increasing hepatic???mass size.  ???  Liver mass, recurrent  Leukocytosis  -history of liver abscess in 2019, status post drainage and 6 weeks ertapenem, 2 weeks Augmentin  -No clear etiology found. Gram stain of perihepatic fluid GPC resembling strep  -Asymptomatic???in interim.?????????1wk lethargy, vague symptoms.??????OSH CT interval increase in size. ???Leukocytosis.  -AST/ALT/ALP elevation 300-400s, w/ mild elevation TBIli  -INR 1.5 - may represent coagulopathy  -s/p Zosyn and 1L fluids.???  -Transferred from OSH in [place] for???higher level of care  -On arrival???VSS (slight tachycardia), 2/4 SIRS, pt NAD. ???Benign abdominal exam.  -7/14 blood cx NG1D  Plan  >Ertapenem 1g q24h first day 7/13  >Hepatology consult; need to acquire OSH films; vitamin K 10 mg IV x3 days (INR 1.4)  > ID consult: continue ertapenem, get OSH films; IR 7/15 for liver abscess aspiration/drainage/biopsy, send fluid/tissue for routine, anaerobic, and fungal cultures; f/u blood cultures; etiology remains unclear, may have to consider MRCP to further evaluate bile ducts and make sure this is not the etiology of recurrent abscesses  > f/u CT abd/pelvis with contrast; will give LR at 142ml/hr following CT scan  > to IR tomorrow for abscess drainage/aspiration/bx, send for cultures  ???  Right UPJ Nephrolithiasis  -9mm stone noted in R UPJ  -alternating R and L back pain, described as sharp and above the hip  -pt unaware of h/o stones. ???  - 7/13 UA negative for infection  Plan  > Per urology: -No acute surgical intervention required at this time; Recommend obtaining imaging while he is being treated (this may be done during monitoring of his liver abscess);???please include the kidneys during this imaging to monitor for increase in hydronephrosis; CT scan or Renal US   -Urology will follow-up on images, please contact with any questions or concerns  ???  ???  Chest Pain, resolved  RBBB  -c/o lower chest/upper abdomen bilateral sharp pain in band-like manner   -trops negative EKG RBBB at OSH. No ekg at [place]  - previous EKG showing RBBB, so not new  - troponin 0.03 7/13  - EKG 7/13: RBBB, inferior infarct, age indeterminate  - pain was actually more epigastric and resolved with GI cocktail  Plan  - continue to monitor  ???  FEN:???500LR, replace electrolytes PRN,???Diet NPO???at midnight  Prophalyxis:??????Heparin  Disposition: Admit to inpatient. ???  Code status:???DNAR-FI???(Discussed on 11/20/2018) ???  ???  ???  Patient seen & plan discussed with Dr. Edd Fabian  ???  Howard Pouch, MD  Anesthesiology, PGY-1  Available on [facility]  Pager [phone]  ________________________________________________________________________    Subjective  Tyler Castillo is a 84 y.o. male.  Patient was doing well this morning. He states that he is having back pain near his lower spine which is made worse by sitting in his chair and laying in his bed a lot. Patient also describes that he has been more confused than baseline and that he is just not feeling like himself. Patient had chest pain last night (ACS r/o by negative ekg and troponins) that was relieved with  GI cocktail. Otherwise, patient denies shortness of breath. He does report that he has not had a bowel movement over the last few days.    Medications  Scheduled Meds:ertapenem (INVanz) IVP 1 g, 1 g, Intravenous, Q24H*  heparin (porcine) PF syringe 5,000 Units, 5,000 Units, Subcutaneous, Q8H  lidocaine (LIDODERM) 5 % topical patch 1 patch, 1 patch, Topical, QDAY    And  Verification of Patch Placement and Integrity - Lidocaine 5%, , Transdermal, BID phytonadione (VITAMIN K) 10 mg in dextrose 5% (D5W) 50 mL IVPB, 10 mg, Intravenous, QDAY    Continuous Infusions:  PRN and Respiratory Meds:acetaminophen/lidocaine/antacid DS(#) Q4H PRN, phenol PRN      Review of Systems:  Review of Systems   Constitutional: Negative for chills and fever.   Respiratory: Negative for shortness of breath.    Cardiovascular: Negative for chest pain.   Gastrointestinal: Negative for abdominal pain, constipation and diarrhea.   Genitourinary: Negative for dysuria.   Musculoskeletal: Positive for back pain. Negative for myalgias.   Skin: Negative for rash.   Neurological: Negative for dizziness and headaches.   Psychiatric/Behavioral: Negative for depression.       Objective:                          Vital Signs: Last Filed                 Vital Signs: 24 Hour Range   BP: 137/59 (07/14 1149)  Temp: 36.3 ???C (97.4 ???F) (07/14 1149)  Pulse: 68 (07/14 1149)  Respirations: 18 PER MINUTE (07/14 1149)  SpO2: 95 % (07/14 1149) BP: (134-150)/(59-68)   Temp:  [36.3 ???C (97.3 ???F)-36.7 ???C (98.1 ???F)]   Pulse:  [68-86]   Respirations:  [16 PER MINUTE-18 PER MINUTE]   SpO2:  [95 %-99 %]    Intensity Pain Scale (Self Report): Asleep (11/21/18 0344) Vitals:    11/20/18 0132   Weight: 83.9 kg (185 lb)       Intake/Output Summary:  (Last 24 hours)    Intake/Output Summary (Last 24 hours) at 11/21/2018 1324  Last data filed at 11/21/2018 1015  Gross per 24 hour   Intake 800 ml   Output 445 ml   Net 355 ml      Stool Occurrence: 0    Physical Exam  Physical Exam   Constitutional: He is oriented to person, place, and time and well-developed, well-nourished, and in no distress.   HENT:   Head: Normocephalic and atraumatic.   Eyes: No scleral icterus.   Neck: Normal range of motion.   Cardiovascular: Normal rate and regular rhythm.   Pulmonary/Chest: Effort normal and breath sounds normal.   Abdominal: Soft. Bowel sounds are normal.   Musculoskeletal:         General: No edema. Neurological: He is alert and oriented to person, place, and time.   Skin: Skin is warm and dry.   Psychiatric: Affect and judgment normal.       Lab Review  24-hour labs:    Results for orders placed or performed during the hospital encounter of 11/20/18 (from the past 24 hour(s))   TROPONIN-I    Collection Time: 11/20/18  5:10 PM   Result Value Ref Range    Troponin-I 0.03 0.0 - 0.05 NG/ML   URINALYSIS DIPSTICK REFLEX TO CULTURE    Collection Time: 11/20/18  7:17 PM   Result Value Ref Range    Color,UA AMBER  Turbidity,UA CLEAR CLEAR-CLEAR    Specific Gravity-Urine 1.033 1.003 - 1.035    pH,UA 5.0 5.0 - 8.0    Protein,UA 2+ (A) NEG-NEG    Glucose,UA NEG NEG-NEG    Ketones,UA NEG NEG-NEG    Bilirubin,UA NEG NEG-NEG    Blood,UA 1+ (A) NEG-NEG    Urobilinogen,UA NORMAL NORM-NORMAL    Nitrite,UA NEG NEG-NEG    Leukocytes,UA NEG NEG-NEG    Urine Ascorbic Acid, UA POS (A) NEG-NEG   URINALYSIS MICROSCOPIC REFLEX TO CULTURE    Collection Time: 11/20/18  7:17 PM   Result Value Ref Range    WBCs,UA 2-10 0 - 2 /HPF    RBCs,UA 2-10 0 - 3 /HPF    Comment,UA       Criteria for reflex to culture are WBC>10, Positive Nitrite, and/or >=+1   leukocytes. If quantity is not sufficient, an addendum will follow.      MucousUA TRACE    CBC AND DIFF    Collection Time: 11/21/18  4:24 AM   Result Value Ref Range    White Blood Cells 17.8 (H) 4.5 - 11.0 K/UL    RBC 3.79 (L) 4.4 - 5.5 M/UL    Hemoglobin 12.8 (L) 13.5 - 16.5 GM/DL    Hematocrit 16.1 (L) 40 - 50 %    MCV 97.6 80 - 100 FL    MCH 33.7 26 - 34 PG    MCHC 34.5 32.0 - 36.0 G/DL    RDW 09.6 11 - 15 %    Platelet Count 183 150 - 400 K/UL    MPV 9.6 7 - 11 FL    Neutrophils 87 (H) 41 - 77 %    Lymphocytes 5 (L) 24 - 44 %    Monocytes 8 4 - 12 %    Eosinophils 0 0 - 5 %    Basophils 0 0 - 2 %    Absolute Neutrophil Count 15.46 (H) 1.8 - 7.0 K/UL    Absolute Lymph Count 0.93 (L) 1.0 - 4.8 K/UL    Absolute Monocyte Count 1.41 (H) 0 - 0.80 K/UL Absolute Eosinophil Count 0.01 0 - 0.45 K/UL    Absolute Basophil Count 0.03 0 - 0.20 K/UL   COMPREHENSIVE METABOLIC PANEL    Collection Time: 11/21/18  4:24 AM   Result Value Ref Range    Sodium 134 (L) 137 - 147 MMOL/L    Potassium 4.1 3.5 - 5.1 MMOL/L    Chloride 99 98 - 110 MMOL/L    Glucose 108 (H) 70 - 100 MG/DL    Blood Urea Nitrogen 42 (H) 7 - 25 MG/DL    Creatinine 0.45 0.4 - 1.24 MG/DL    Calcium 9.5 8.5 - 40.9 MG/DL    Total Protein 6.2 6.0 - 8.0 G/DL    Total Bilirubin 2.1 (H) 0.3 - 1.2 MG/DL    Albumin 3.0 (L) 3.5 - 5.0 G/DL    Alk Phosphatase 811 (H) 25 - 110 U/L    AST (SGOT) 169 (H) 7 - 40 U/L    CO2 24 21 - 30 MMOL/L    ALT (SGPT) 294 (H) 7 - 56 U/L    Anion Gap 11 3 - 12    eGFR Non African American 58 (L) >60 mL/min    eGFR African American >60 >60 mL/min   PROTIME INR (PT)    Collection Time: 11/21/18  4:24 AM   Result Value Ref Range    INR 1.4 (H) 0.8 - 1.2  Point of Care Testing  (Last 24 hours)  Glucose: (!) 108 (11/21/18 0424)    Radiology and other Diagnostics Review:    OSH CT was very unclear on our system. Will request again and if we cannot get a good scan, we will repeat CT    Howard Pouch, MD   Pager

## 2018-11-21 NOTE — Progress Notes
Infectious Disease Progress Note    Today's Date:  11/21/2018  Admission Date: 11/20/2018    Reason for this consultation: Patient with history of hepatic mass of unclear etiology with prior cultures growing GPC resembling strep. Treated with ertapenem. Patient presenting with new similar appearing lesion. Please assist with antibiotic recommendations.    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 No viable tumor cells are present. ???The finding may represent reaction adjacent to mass lesion, or may represent a liver abscess.  -10/26/17 normal liver biopsy: Mild sinusoidal dilation and changes consistent with biliary obstruction. No significant fibrosis.   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 Review of outside CT by IR - felt significantly improved and drain could be removed   -11/21/17 IR drain removal. Abscessogram- no fistula  -12/01/17 finished ertapenem and started amox/clav, which he finished ~12/17/17  -12/22/17 another 14-day course of augmentin prescribed  -11/19/18 patient presented to [facility] ED with weakness, malaise, right-sided chest discomfort  -11/19/18 CT abdomen/pelvis: 7.2 x 8.9 cm heterogenous mass within hepatic dome posterior segment of R hepatic lobe; increased in size from prior study (July 19); interval development of 2.9x 3.0cm heterogenous mass within L hepatic lobe; interval development of mild (R) sided hydronephrosis secondary to 9mm calculus of right UPJ  -7/12 CXR: Cardiomegaly, chronic small right pleural effusion, right basilar atelectasis  -Leukocytosis and elevated liver enzymes  ???  Right hydronephrosis  Right UPJ nephrolithiasis  -11/19/2018 CT abdomen and pelvis: 9mm calculus of right UPJ    History of AKI- resolved    History of Viral pericarditis status-post pericardiectomy in 1993    Hyperlipidemia    Former tobacco use (quit 1978)    Recommendations:     1. Continue ertapenem for now  2. Outside CT obtained but the films did not appropriately upload and cannot be read.  Please obtain appropriate films to review.  3. Please schedule the patient to go to IR for liver abscess aspiration/drainage/biopsy.  Send fluid/tissue for routine, anaerobic, and fungal cultures  4. Follow-up blood cultures   5. The etiology of the initial abscess/recurrence remains unclear.  We might have to consider MRCP to further evaluate the bile ducts and make sure this is not the etiology of recurrent abscesses.  6. Monitor temp and WBC count    We'll follow  __________________________________________________________________________________    Subjective/Interval History     Patient remains afebrile  Still having fatigue  Reports poor appetite and some nausea this morning  No SOB or cough  No vomiting, diarrhea or abdominal  pain  No dysuria???    WBC 17.8  Hb 12.8  Platelets 183  Creatinine 1.2  AST 169  ALT 294  ALP 338   T bili 2.1 7/13 UA: 2-10 WBC, negative leukocytes, negative nitrite, 1+ blood, 2-10 RBC; UC pending  7/13 BC x 2 sets NTD  7/13 fungal blood cultures x2 sets in process    Discussed with Dr. Adela Lank.  ???  Antimicrobial Start date End date   Zosyn   6/17 x 1 dose; 6/19-6/25/19   11/19/2018 x1 dose ???   Ertapenem  6/25-7/25/19; 11/20/2018  active   Augmentin  7/25-8/10; 12/22/17 ???01/05/18   ??? ??? ???   ??? ??? ???   ??? ??? ???   ??? ??? ???   Estimated Creatinine Clearance: 50 mL/min (based on SCr of 1.2 mg/dL).    Medications   Scheduled Meds:ertapenem (INVanz) IVP 1 g, 1 g, Intravenous, Q24H*  heparin (porcine) PF syringe 5,000 Units, 5,000 Units, Subcutaneous, Q8H  lidocaine (LIDODERM) 5 % topical patch 1 patch, 1 patch, Topical, QDAY    And  Verification of Patch Placement and Integrity - Lidocaine 5%, , Transdermal, BID    Continuous Infusions:  PRN and Respiratory Meds:acetaminophen/lidocaine/antacid DS(#) Q4H PRN, phenol PRN      Allergies   No Known Allergies      Physical Examination                          Vital Signs: Last                  Vital Signs: 24 Hour Range   BP: 139/68 (07/14 0421)  Temp: 36.5 ???C (97.7 ???F) (07/14 0421)  Pulse: 68 (07/14 0421)  Respirations: 17 PER MINUTE (07/14 0421)  SpO2: 99 % (07/14 0421) BP: (134-150)/(60-68)   Temp:  [36.3 ???C (97.3 ???F)-36.7 ???C (98.1 ???F)]   Pulse:  [68-86]   Respirations:  [16 PER MINUTE-18 PER MINUTE]   SpO2:  [96 %-99 %]      General appearance: Alert, oriented x 3, in NAD  HENT: No thrush  Lungs: Decreased breath sounds over the right lower lung with occasional fine crackles.crackles over the left base.  Otherwise CTAB, no wheezing, rhonchi  Heart: Regular rhythm, reg rate, no murmur, rub, gallop  Abdomen: Soft, non-tender, non-distended, normoactive bowel sounds  Ext: No clubbing, cyanosis; mild bilateral lower extremity edema  Skin: No rash    Lines: PIV      Lab Review   Hematology  Recent Labs     11/20/18  0345 11/21/18  0424   WBC 19.9* 17.8*   HGB 12.4* 12.8*   HCT 35.9* 37.0* PLTCT 164 183   PTT 24.7  --    INR 1.4* 1.4*     Chemistry  Recent Labs     11/20/18  0345 11/21/18  0424   NA 132* 134*   K 4.2 4.1   CL 99 99   CO2 24 24   BUN 32* 42*   CR 1.26* 1.20   GFR 55* 58*   GLU 91 108*   CA 10.0 9.5   PO4 2.1  --    ALBUMIN 2.9* 3.0*   ALKPHOS 422* 338*   AST 236* 169*   ALT 343* 294*   TOTBILI 1.7* 2.1*       Microbiology, Radiology and other Diagnostics Review   Microbiology data reviewed.    Pertinent radiology images viewed.  Theotis Burrow, MD  Division of Infectious Diseases   Pager [phone]

## 2018-11-21 NOTE — Progress Notes
1845 pt left unit via bed by transport to CT. RN administered Ativan IV per [facility] prior to leaving for CT.

## 2018-11-21 NOTE — Consults
Interventional Radiology Consult Note with Pre-procedural History and Physical      Admission Date: 11/20/2018  LOS: 1 day                       Principal Problem:    Liver mass  Active Problems:    Leucocytosis      Reason for consult: Left hepatic abscess aspiration with possible drain placement if IR feels indicated    Assessment:   - Admitted with leukocytosis, lethargy and increasing size of hepatic mass   - OSH CT reviewed by Dr. Cameron Ali and left hepatic lesion amendable to aspiration  - Pt had right hepatic abscess in 2019 s/p drain placement by IR 10/26/17  - Patient aox4, nad with c/o intermittent dyspnea and abdominal pain  - Labs, medications, and allergies meet procedural protocol.   - Pt is not on a therapeutic blood thinner - pt on prophylactic heparin, team to hold 2 doses  -   Platelet Count   Date Value Ref Range Status   11/21/2018 183 150 - 400 K/UL Final   ;   INR   Date Value Ref Range Status   11/21/2018 1.4 (H) 0.8 - 1.2 Final       Plan:  - This case has been reviewed and added onto the [facility] schedule for 11/22/2018.   - For sedation purposes, please keep NPO @ [facility] prior to procedure. May take PO medications with sips of water.    We appreciate being able to participate in this patient's care. Please page with any questions or concerns.    Toya Smothers, MSN,APRN   Pgr [phone]    IR Team Pager [phone] (After-hours and Weekends)  __________________________________________________________________      Procedure: Left hepatic lesion aspiration with possible drain placement    IR Pre Procedure Notes:  Consent w UC. [place] or CT per [facility]. Team ok with drain placement if indicated.   __________________________________________________________________    Chief Complaint:  Left hepatic lesion aspiration with possible drain placement    Previous Anesthetic/Sedation History:  Reviewed.    History of present illness:  Tyler Castillo is a 84 y.o. male patient with hx of hepatic mass, hyperlipidemia admitted with leukocytosis, lethargy and increasing hepatic mass size. IR consulted for left hepatic lesion aspiration. See ROS below for current symptoms.     Review of Systems  Constitutional: negative for fevers and chills  Respiratory: negative for cough, positive for dyspnea  Gastrointestinal: positive for abdominal pain, negative for nausea and vomiting    Medications  Scheduled Meds:ertapenem (INVanz) IVP 1 g, 1 g, Intravenous, Q24H*  heparin (porcine) PF syringe 5,000 Units, 5,000 Units, Subcutaneous, Q8H  lidocaine (LIDODERM) 5 % topical patch 1 patch, 1 patch, Topical, QDAY    And  Verification of Patch Placement and Integrity - Lidocaine 5%, , Transdermal, BID  phytonadione (VITAMIN K) 10 mg in dextrose 5% (D5W) 50 mL IVPB, 10 mg, Intravenous, QDAY  polyethylene glycol 3350 (MIRALAX) packet 17 g, 1 packet, Oral, QDAY  senna/docusate (SENOKOT-S) tablet 1 tablet, 1 tablet, Oral, BID    Continuous Infusions:  PRN and Respiratory Meds:acetaminophen/lidocaine/antacid DS(#) Q4H PRN, phenol PRN      Objective                       Vital Signs: Last Filed                 Vital Signs: 24 Hour  Range   BP: 137/59 (07/14 1149)  Temp: 36.3 ???C (97.4 ???F) (07/14 1149)  Pulse: 68 (07/14 1149)  Respirations: 18 PER MINUTE (07/14 1149)  SpO2: 95 % (07/14 1149) BP: (134-150)/(59-68)   Temp:  [36.3 ???C (97.3 ???F)-36.7 ???C (98.1 ???F)]   Pulse:  [68-86]   Respirations:  [16 PER MINUTE-18 PER MINUTE]   SpO2:  [95 %-99 %]    Intensity Pain Scale (Self Report): Asleep (11/21/18 0344) Vitals:    11/20/18 0132   Weight: 83.9 kg (185 lb)         Intake/Output Summary:  (Last 24 hours)    Intake/Output Summary (Last 24 hours) at 11/21/2018 1458  Last data filed at 11/21/2018 1015  Gross per 24 hour   Intake 800 ml   Output 445 ml   Net 355 ml      Stool Occurrence: 0    Physical Exam  General appearance: alert and no distress  Neurologic: Grossly normal, at baseline  Lungs: Nonlabored with normal effort Abdomen: soft, non-tender.     Airway:  airway assessment performed  Mallampati III (soft palate, base of uvula visible)   Anesthesia Classification:  ASA III (A patient with a severe systemic disease that limits activity, but is not incapacitating)  Pre procedure anxiolysis plan: Midazolam  Intra-procedural Sedation/Medication Plan: Fentanyl, Lidocaine and Midazolam  Personal history of sedation complications: Denies adverse event.   Family history of sedation complications: Denies adverse event.   Medications for Reversal: Naloxone and Flumazenil  Discussion/Reviews:  Physician has discussed risks and alternatives of this type of sedation and above planned procedures with patient  NPO Status: Acceptable   Pregnancy Status: N/A    Lab/Radiology/Other Diagnostic Tests:  Labs:  Pertinent labs reviewed  Radiology: Reviewed.

## 2018-11-22 ENCOUNTER — Encounter: Admit: 2018-11-22 | Discharge: 2018-11-22

## 2018-11-22 DIAGNOSIS — R160 Hepatomegaly, not elsewhere classified: Secondary | ICD-10-CM

## 2018-11-22 LAB — CBC AND DIFF: Lab: 97 FL — ABNORMAL HIGH (ref >60)

## 2018-11-22 LAB — COMPREHENSIVE METABOLIC PANEL
Lab: 136 MMOL/L — ABNORMAL LOW (ref >60)
Lab: 3.9 MMOL/L — ABNORMAL LOW (ref >60)
Lab: 45 mg/dL — ABNORMAL HIGH (ref 7–25)

## 2018-11-22 LAB — PROTIME INR (PT): Lab: 1.4 mg/dL — ABNORMAL HIGH (ref >60)

## 2018-11-22 LAB — GRAM STAIN

## 2018-11-22 MED ORDER — FENTANYL CITRATE (PF) 50 MCG/ML IJ SOLN
12.5-25 ug | INTRAVENOUS | 0 refills | Status: DC | PRN
Start: 2018-11-22 — End: 2018-11-29
  Administered 2018-11-23 (×4): 12.5 ug via INTRAVENOUS
  Administered 2018-11-25: 05:00:00 25 ug via INTRAVENOUS

## 2018-11-22 MED ORDER — FENTANYL CITRATE (PF) 50 MCG/ML IJ SOLN
0 refills | Status: CP
Start: 2018-11-22 — End: ?
  Administered 2018-11-22 (×2): 50 ug via INTRAVENOUS

## 2018-11-22 MED ORDER — SODIUM CHLORIDE 0.9 % IV SOLP
0 refills | Status: CP
Start: 2018-11-22 — End: ?
  Administered 2018-11-22: 14:00:00 125 mL/h via INTRAVENOUS

## 2018-11-22 MED ORDER — TRAMADOL 50 MG PO TAB
50 mg | Freq: Once | ORAL | 0 refills | Status: CP
Start: 2018-11-22 — End: ?
  Administered 2018-11-23: 50 mg via ORAL

## 2018-11-22 MED ORDER — ACETAMINOPHEN 325 MG PO TAB
650 mg | Freq: Once | ORAL | 0 refills | Status: CP
Start: 2018-11-22 — End: ?
  Administered 2018-11-23: 650 mg via ORAL

## 2018-11-22 MED ORDER — SODIUM CHLORIDE 0.9 % IJ SYRG
10 mL | INTRAVENOUS | 0 refills | Status: DC
Start: 2018-11-22 — End: 2018-11-29
  Administered 2018-11-22 – 2018-11-28 (×18): 10 mL via INTRAVENOUS

## 2018-11-22 MED ORDER — MIDAZOLAM 1 MG/ML IJ SOLN
0 refills | Status: CP
Start: 2018-11-22 — End: ?
  Administered 2018-11-22: 14:00:00 1 mg via INTRAVENOUS

## 2018-11-22 NOTE — Progress Notes
Pt coughing up yellow sputum this AM.  MD made aware.  Will continue to monitor and assess.

## 2018-11-22 NOTE — Patient Instructions
ABSCESS/FLUID COLLECTION DRAINAGE???  An abscess is a localized collection of infected fluid that usually occurs in the abdomen, pelvis, or chest.??? In some cases, a fluid collection may form that is not infected but that may need to be drained to alleviate pressure or pain on surrounding tissues.??? During abscess or fluid collection drainage, the Interventional Radiologist inserts a small, flexible tube into the abscess or fluid collection using x-ray or CT guidance.??? The tube, which may also be referred to as a drain or catheter, is then connected to a drainage bag, suction bulb or other drainage reservoir (often referred to as a ???j-vac???) which assists in removing the abscess drainage or fluid collection from your body.?????????  POST-PROCEDURE ACTIVITY:  ??? A responsible adult must drive you home.??? If you receive sedation or anesthesia, do not drive, operate heavy machinery or do anything that requires concentration for at least 24 hours.  ??? It is recommended that a responsible adult be with you until morning.  ??? Avoid any strenuous activity that may affect the drain.??? Do not lift more than 10 lbs. and avoid pushing, pulling or straining while the drain is in place.  ??? Avoid any activity that causes tension or pulling on the drain and avoid bending or crimping the tube.??? Never use scissors, pins or other sharp objects near the tube.  POST-PROCEDURE SITE CARE:  ??? Wash your hands thoroughly before handling the tube or touching near the tube site.  ??? Remove the bandage and clean around the tube every 2 days or more often if it becomes wet or soiled. ???  1. Clean around the tube with mild soap and clean gauze and rinse with saline or water.???  2. When cleaning, start by cleaning in a circular motion around the tube site and work outward for 3-4 inches.???  3. Rinse and gently dry.???  4. Place a new clean bandage around the tube and tape to secure.  ??? Keep the bandage dry at all times.??? You may shower but you must cover the tube site with a waterproof covering such as plastic wrap.??? If the bandage becomes wet or soiled, it should be changed immediately.  ??? Do not submerge the tube or the tube site underwater (no tub bath, swimming, hot tub, etc.)  ??? Do not use ointments, creams or powders around the tube unless specifically ordered by the doctor.  ??? Wash your hands thoroughly before???handling the tube or touching near the tube site.  FLUSHING THE DRAIN:  ??? You will need to flush the drain with 5 mL of sterile saline every 8 hours or as ordered by the doctor.??? Flushing the drain will help keep the tube functioning properly.??? Always wash your hands thoroughly prior to this step.  ??? To flush the drain:???  1. Turn the stopcock off to the drainage bag.  2. Clean the flushing port with an alcohol wipe for 30 seconds.  3. Attach the flush syringe.  4. Gently inject the saline and remove the syringe.  5. Turn the stopcock off to the flushing port and open to the drainage bag or reservoir.  ??? Write down???the daily output of your drain.??? Subtract the total amount of saline used for flushing.??? When the amount of drainage drops to less than 10-15 mL per day, it will be time to have the drain reassessed.??? Be sure to notify your doctor when this happens.  ???EMPTYING THE DRAIN:  ??? Empty the drain when flushing or as needed if the drain reservoir appears  to be full.  ??? To empty the j-vac:  1. Turn the stopcock off to the body.  2. Open the drainage container or???reservoir by removing the cap.???  3. Empty the fluid???and???measure the amount of drainage.??? Discard the fluid into the toilet.  4. When emptied, compress the j-vac with both hands as illustrated on the drainage???reservoir.  5. After hearing a click, replace the cap. Re-open the stopcock.  6. Activate the j-vac by bending the bottom of the drain up, until you hear another click.???  7. ???Write down the amount of drainage on your drain log.???  DIET/MEDICATIONS: ??? You may resume your previous diet after the procedure.???  ??? If you receive sedation or anesthesia, avoid any foods or beverages containing alcohol for at least 24 hours.  ??? Please see the Medication Reconciliation sheet for instructions regarding resuming your home medications.???  CALL THE DOCTOR IF:  ??? Bright red blood soaks the bandage around the drain site or frank blood is seen in the drain tube or reservoir.??? You may notice???a small amount of blood in the drainage for 1-2 days.  ??? The tube has become dislodged or pulled out.  ??? No drainage is seen coming out of the drain or you are unable to flush the drain tube.  ??? Drainage is leaking around the tube site onto the skin.  ??? You have new or worsened pain.??? Some soreness is to be expected.  ??? You have new or worsened signs of infection such as redness around tube site or fever greater than 101F.  You or???your caregiver should call 911 for any severe bleeding,???dizziness, shortness of breath or loss of consciousness.???  For any of the above symptoms or for problems or concerns related to the procedure,??????call??? [phone] for Monday-Friday 7-5.??? After-hours and weekends, please call??????[phone] and ask for the Interventional Radiology Resident on-call.

## 2018-11-22 NOTE — Progress Notes
General Progress Note    Name:  Tyler Castillo   AVWUJ'W Date:  11/22/2018  Admission Date: 11/20/2018  LOS: 2 days                     Assessment/Plan:    Principal Problem:    Liver mass  Active Problems:    Leucocytosis    84 y.o.?????????male???past medical history hepatic mass, hyperlipidemia ???who presents with ???leukocytosis, lethargy, increasing hepatic???mass size.  ???  Liver mass, recurrent  Leukocytosis  -history of liver abscess in 2019, status post drainage and 6 weeks ertapenem, 2 weeks Augmentin  -No clear etiology found. Gram stain of perihepatic fluid GPC resembling strep  -Asymptomatic???in interim.?????????1wk lethargy, vague symptoms.??????OSH CT interval increase in size. ???Leukocytosis.  -AST/ALT/ALP decreasing since admit: AST 129, ALT 223 and ALP 428 on 7/15   -INR 1.4 7/15  -s/p Zosyn and 1L fluids.???  -Transferred from OSH in [place] for???higher level of care  -On arrival???VSS (slight tachycardia), 2/4 SIRS, pt NAD. ???Benign abdominal exam.  -7/15 blood cx NG2D  -  IR 7/15 for liver abscess aspiration/drainage/biopsy, sent fluid/tissue for routine, anaerobic, and fungal cultures  - CT Abd/Pelvis 7/14: 2 new adjacent hepatic abcesses/masses in cephalad liver (larger measuring up to 10cm) may reflect recurrent abscess but cholangiocarcinoma could appear similar; right UPJ calculus 6mm with upstream pelvocaliectasis without frank hydronephrosis. Adjacent urothelial thickening likely reflects inflammation (UA negative). 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  Plan  >Ertapenem 1g q24h first day 7/13  >Hepatology consult: vitamin K 10 mg IV x3 days (INR 1.4); EUS/ERCP tomorrow 7/16, NPO at midnight  > ID consult: continue ertapenem; f/u blood cultures; etiology remains unclear, may have to consider MRCP to further evaluate bile ducts and make sure this is not the etiology of recurrent abscesses  > f/u path  ???  Right UPJ Nephrolithiasis  -9mm stone noted in R UPJ  -alternating R and L back pain, described as sharp and above the hip  -pt unaware of h/o stones. ???  - 7/13 UA negative for infection  Plan  > Per urology:???-No acute surgical intervention required at this time; Recommend obtaining imaging while he is being treated (this may be done during monitoring of his liver abscess);???please include the kidneys during this imaging to monitor for increase in hydronephrosis; CT scan or Renal US   -Urology will follow-up on images, please contact with any questions or concerns  ???  ???  Chest Pain, resolved  RBBB  -c/o lower chest/upper abdomen bilateral sharp pain in band-like manner   -trops negative EKG RBBB at OSH. No ekg at [facility]  - previous EKG showing RBBB, so not new  - troponin 0.03 7/13  - EKG???7/13: RBBB, inferior infarct, age indeterminate  - pain was actually more epigastric and resolved with GI cocktail  Plan  - continue to monitor  ???  FEN:???500LR, replace electrolytes PRN, regular diet following IR procedure  Prophalyxis:??????Heparin held  Disposition: Admit to inpatient. ???  Code status:???DNAR-FI???(Discussed on 11/20/2018)??????  ???  ???  Patient seen & plan discussed with Dr.???Opole  ???  Howard Pouch, MD  Anesthesiology, PGY-1  Available on [facility]  Pager [phone]  ________________________________________________________________________    Subjective  Tyler Castillo is a 84 y.o. male.  Patient was a little groggy following IR procedure today. He reported that  he was feeling week. Denies pain, denies constipation. Patient was getting ready to be assessed by PT.    Medications  Scheduled Meds:ertapenem (INVanz) IVP 1 g, 1 g, Intravenous, Q24H*  [MAR Hold] lidocaine (LIDODERM) 5 % topical patch 1 patch, 1 patch, Topical, QDAY    And [MAR Hold] Verification of Patch Placement and Integrity - Lidocaine 5%, , Transdermal, BID  [MAR Hold] phytonadione (VITAMIN K) 10 mg in dextrose 5% (D5W) 50 mL IVPB, 10 mg, Intravenous, QDAY  [MAR Hold] polyethylene glycol 3350 (MIRALAX) packet 17 g, 1 packet, Oral, QDAY  [MAR Hold] senna/docusate (SENOKOT-S) tablet 1 tablet, 1 tablet, Oral, BID    Continuous Infusions:  ??? lactated ringers infusion 500 mL (11/21/18 2019)     PRN and Respiratory Meds:[MAR Hold] acetaminophen/lidocaine/antacid DS(#) Q4H PRN, [MAR Hold] phenol PRN, [MAR Hold] prochlorperazine Q6H PRN      Review of Systems:  Review of Systems   Constitutional: Negative for chills and fever.   Respiratory: Negative for shortness of breath.    Cardiovascular: Negative for chest pain.   Gastrointestinal: Negative for abdominal pain, constipation and diarrhea.   Genitourinary: Negative for dysuria.   Musculoskeletal: Negative for myalgias.   Skin: Negative for rash.   Neurological: Positive for weakness. Negative for dizziness and headaches.   Psychiatric/Behavioral: Negative for depression.         Objective:                          Vital Signs: Last Filed                 Vital Signs: 24 Hour Range   BP: 135/52 (07/15 0850)  Temp: 36.6 ???C (97.9 ???F) (07/15 1610)  Pulse: 74 (07/15 0855)  Respirations: 7 PER MINUTE (07/15 0855)  SpO2: 84 % (07/15 0855)  SpO2 Pulse: 74 (07/15 0855) BP: (131-163)/(52-73)   Temp:  [36.3 ???C (97.4 ???F)-36.6 ???C (97.9 ???F)]   Pulse:  [64-76]   Respirations:  [7 PER MINUTE-27 PER MINUTE]   SpO2:  [84 %-99 %]    Intensity Pain Scale (Self Report): 5 (11/22/18 9604) Vitals:    11/20/18 0132   Weight: 83.9 kg (185 lb)       Intake/Output Summary:  (Last 24 hours)    Intake/Output Summary (Last 24 hours) at 11/22/2018 0902  Last data filed at 11/22/2018 0853  Gross per 24 hour   Intake 220 ml   Output 733 ml   Net -513 ml      Stool Occurrence: 0    Physical Exam  Physical Exam Constitutional: He is oriented to person, place, and time and well-developed, well-nourished, and in no distress.   HENT:   Head: Normocephalic and atraumatic.   Eyes: No scleral icterus.   Neck: Normal range of motion.   Cardiovascular: Normal rate and regular rhythm.   Pulmonary/Chest: Effort normal. No respiratory distress.   Abdominal: Soft. Bowel sounds are normal.   Musculoskeletal:         General: No edema.   Neurological: He is alert and oriented to person, place, and time.   Skin: Skin is warm and dry.   Psychiatric: Affect and judgment normal.         Lab Review  24-hour labs:    Results for orders placed or performed during the hospital encounter of 11/20/18 (from the past 24 hour(s))   CBC AND DIFF    Collection Time: 11/22/18  4:41 AM  Result Value Ref Range    White Blood Cells 22.1 (H) 4.5 - 11.0 K/UL    RBC 3.84 (L) 4.4 - 5.5 M/UL    Hemoglobin 12.7 (L) 13.5 - 16.5 GM/DL    Hematocrit 16.1 (L) 40 - 50 %    MCV 97.3 80 - 100 FL    MCH 33.2 26 - 34 PG    MCHC 34.1 32.0 - 36.0 G/DL    RDW 09.6 11 - 15 %    Platelet Count 178 150 - 400 K/UL    MPV 9.6 7 - 11 FL    Neutrophils 89 (H) 41 - 77 %    Lymphocytes 5 (L) 24 - 44 %    Monocytes 6 4 - 12 %    Eosinophils 0 0 - 5 %    Basophils 0 0 - 2 %    Absolute Neutrophil Count 19.60 (H) 1.8 - 7.0 K/UL    Absolute Lymph Count 1.10 1.0 - 4.8 K/UL    Absolute Monocyte Count 1.40 (H) 0 - 0.80 K/UL    Absolute Eosinophil Count 0.00 0 - 0.45 K/UL    Absolute Basophil Count 0.00 0 - 0.20 K/UL   COMPREHENSIVE METABOLIC PANEL    Collection Time: 11/22/18  4:41 AM   Result Value Ref Range    Sodium 136 (L) 137 - 147 MMOL/L    Potassium 3.9 3.5 - 5.1 MMOL/L    Chloride 97 (L) 98 - 110 MMOL/L    Glucose 90 70 - 100 MG/DL    Blood Urea Nitrogen 45 (H) 7 - 25 MG/DL    Creatinine 0.45 0.4 - 1.24 MG/DL    Calcium 9.1 8.5 - 40.9 MG/DL    Total Protein 6.5 6.0 - 8.0 G/DL    Total Bilirubin 2.6 (H) 0.3 - 1.2 MG/DL    Albumin 2.9 (L) 3.5 - 5.0 G/DL Alk Phosphatase 811 (H) 25 - 110 U/L    AST (SGOT) 129 (H) 7 - 40 U/L    CO2 26 21 - 30 MMOL/L    ALT (SGPT) 223 (H) 7 - 56 U/L    Anion Gap 13 (H) 3 - 12    eGFR Non African American >60 >60 mL/min    eGFR African American >60 >60 mL/min   PROTIME INR (PT)    Collection Time: 11/22/18  4:41 AM   Result Value Ref Range    INR 1.4 (H) 0.8 - 1.2   CULTURE-WOUND/TISSUE/FLUID(AEROBIC ONLY)W/SENSITIVITY    Collection Time: 11/22/18  8:48 AM   Result Value Ref Range    Battery Name ROUTINE CULTURE     Specimen Description MISC FLUID  HEPATIC MASS       Special Requests NONE     Direct Gram Stain MANY  NEUTROPHILS  NO ORGANISMS SEEN       Culture      Report Status     GRAM STAIN    Collection Time: 11/22/18  8:48 AM   Result Value Ref Range    Battery Name GRAM STAIN      Specimen Description MISC FLUID  HEPATIC MASS       Special Requests NONE     Gram Stain MANY  NEUTROPHILS  NO ORGANISMS SEEN       Report Status FINAL  11/22/2018          Point of Care Testing  (Last 24 hours)  Glucose: 90 (11/22/18 0441)    Radiology and other Diagnostics Review:  CT abd/pelvis 7/14  1. Development of 2 new adjacent hepatic abscess/masses in the cephalad   liver, as described, the larger measuring up to 10 cm. Given prior   clinical history, these likely reflect recurrent abscesses however   underlying hepatic mass such as cholangiocarcinoma could appear similar.  2. Right ureteropelvic junction calculus measuring 6 mm with mild upstream   pelvocaliectasis without frank hydronephrosis. Adjacent urothelial   thickening likely reflects inflammation. Urinalysis is recommended.  3. Persistent small left lower pole nonobstructing renal calculus.  4. Marked sigmoid colon diverticulosis.  5. 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    Howard Pouch, MD   Pager

## 2018-11-22 NOTE — Progress Notes
Infectious Disease Progress Note    Today's Date:  11/22/2018  Admission Date: 11/20/2018    Reason for this consultation: Patient with history of hepatic mass of unclear etiology with prior cultures growing GPC resembling strep. Treated with ertapenem. Patient presenting with new similar appearing lesion. Please assist with antibiotic recommendations.    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 No viable tumor cells are present. ???The finding may represent reaction adjacent to mass lesion, or may represent a liver abscess.  -10/26/17 normal liver biopsy: Mild sinusoidal dilation and changes consistent with biliary obstruction. No significant fibrosis.   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 Review of outside CT by IR - felt significantly improved and drain could be removed   -11/21/17 IR drain removal. Abscessogram- no fistula -12/01/17 finished ertapenem and started amox/clav, which he finished ~12/17/17  -12/22/17 another 14-day course of augmentin prescribed  -11/19/18 patient presented to [facility] ED with weakness, malaise, right-sided chest discomfort  -11/19/18 CT abdomen/pelvis: 7.2 x 8.9 cm heterogenous mass within hepatic dome posterior segment of R hepatic lobe; increased in size from prior study (July 19); interval development of 2.9x 3.0cm heterogenous mass within L hepatic lobe; interval development of mild (R) sided hydronephrosis secondary to 9mm calculus of right UPJ  -7/12 CXR: Cardiomegaly, chronic small right pleural effusion, right basilar atelectasis  -Leukocytosis and elevated liver enzymes  -7/14 CT abdomen and pelvis: There is persistent cholelithiasis. The hepatic abscess previously demonstrated in the caudal right lobe adjacent to the gallbladder is not distinctly visualized and has likely resolved. Development of 2 new adjacent hepatic low-density lesions in the cephalad liver. The larger is centered in hepatic segments 7/8 and measures approximately 10 x 7 x 9 cm (axial image 17 and coronal image 87). The adjacent smaller abscess is within hepatic segment 4A and measures approximately 5 x 3.5 x 4.5 cm (axial image 15 and coronal image 50).  -7/15 IR placed a drain in the smaller left-sided hepatic abscess; 20 mL of bloody purulent fluid was aspirated.  No biopsy done and no drain placed in the large right-sided hepatic abscess  -7/15 smaller left-sided hepatic abscess fluid GS: Many PMN, no org; culture in progress  ???  Right hydronephrosis  Right UPJ nephrolithiasis  -11/19/2018 CT abdomen and pelvis: 9mm calculus of right UPJ  -7/14 CT abdomen and pelvis: Right ureteropelvic junction calculus measuring 6 mm with mild upstream pelvocaliectasis without frank hydronephrosis. Adjacent urothelial thickening likely reflects inflammation. Persistent small left lower pole nonobstructing renal calculus. History of  AKI- resolved    History of Viral pericarditis status-post pericardiectomy in 1993    Hyperlipidemia    Former tobacco use (quit 1978)    Recommendations:     1. Continue ertapenem for now  2. IR placed a drain in the smaller left-sided hepatic abscess.  We still need to have the right-sided large hepatic abscess drained and we need to have a biopsy to look for evidence of malignancy.  Send fluid/tissue for routine, anaerobic, and fungal cultures  3. Follow-up cultures   4. The etiology of the initial abscess/recurrence remains unclear.  Discussed with him pathology.  We will proceed with ERCP if the patient is agreeable to further evaluate the bile ducts.  5. Monitor temp and WBC count    We'll follow  __________________________________________________________________________________    Subjective/Interval History     Patient remains afebrile  Had a drain placed by IR today and the smaller abscess  Still having fatigue  No chest pain or right flank pain  No SOB   Has been having a cough since yesterday  Had posttussive emesis yesterday  No nausea or vomiting today  No diarrhea or abdominal pain  No dysuria???    WBC 22.1 <--17.8  Hb 12.7  Platelets 178  Creatinine 1.16  AST 129 <--169 <--236  ALT 223 <--294 <--343  ALP 428 <--338 <--422   T bili 2.6 <--2.1 <--1.7    7/13 UA: 2-10 WBC, negative leukocytes, negative nitrite, 1+ blood, 2-10 RBC; UC pending  7/13 BC x 2 sets NTD  7/13 fungal blood cultures x2 sets in process    Discussed with Dr. Adela Lank.  ???  Antimicrobial Start date End date   Zosyn   6/17 x 1 dose; 6/19-6/25/19   11/19/2018 x1 dose ???   Ertapenem  6/25-7/25/19; 11/20/2018  active   Augmentin  7/25-8/10; 12/22/17 ???01/05/18   ??? ??? ???   ??? ??? ???   ??? ??? ???   ??? ??? ???   Estimated Creatinine Clearance: 51.7 mL/min (based on SCr of 1.16 mg/dL).    Medications   Scheduled Meds:ertapenem (INVanz) IVP 1 g, 1 g, Intravenous, Q24H*  [MAR Hold] lidocaine (LIDODERM) 5 % topical patch 1 patch, 1 patch, Topical, QDAY And  [MAR Hold] Verification of Patch Placement and Integrity - Lidocaine 5%, , Transdermal, BID  [MAR Hold] phytonadione (VITAMIN K) 10 mg in dextrose 5% (D5W) 50 mL IVPB, 10 mg, Intravenous, QDAY  [MAR Hold] polyethylene glycol 3350 (MIRALAX) packet 17 g, 1 packet, Oral, QDAY  [MAR Hold] senna/docusate (SENOKOT-S) tablet 1 tablet, 1 tablet, Oral, BID    Continuous Infusions:  ??? lactated ringers infusion 500 mL (11/21/18 2019)     PRN and Respiratory Meds:[MAR Hold] acetaminophen/lidocaine/antacid DS(#) Q4H PRN, [MAR Hold] phenol PRN, [MAR Hold] prochlorperazine Q6H PRN      Allergies   No Known Allergies      Physical Examination                          Vital Signs: Last                  Vital Signs: 24 Hour Range   BP: 131/58 (07/15 0452)  Temp: 36.6 ???C (97.8 ???F) (07/15 1914)  Pulse: 67 (07/15 0452)  Respirations: 16 PER MINUTE (07/15 0452)  SpO2: 94 % (07/15 0452) BP: (131-148)/(58-67)   Temp:  [36.3 ???C (97.4 ???F)-36.7 ???C (98 ???F)]   Pulse:  [66-69]  Respirations:  [16 PER MINUTE-18 PER MINUTE]   SpO2:  [92 %-98 %]      General appearance: Alert, oriented x 3, in NAD  HENT: No thrush  Lungs: CTAB anteriorly and laterally, no wheezing, rhonchi  Heart: Regular rhythm, reg rate, no murmur, rub, gallop  Abdomen: Soft, non-tender, non-distended, J-Vac drain in the right upper quadrant draining sanguinous fluid, normoactive bowel sounds  Ext: No clubbing, cyanosis; mild bilateral lower extremity edema  Skin: No rash    Lines: PIV      Lab Review   Hematology  Recent Labs     11/20/18  0345 11/21/18  0424 11/22/18  0441   WBC 19.9* 17.8* 22.1*   HGB 12.4* 12.8* 12.7*   HCT 35.9* 37.0* 37.3*   PLTCT 164 183 178   PTT 24.7  --   --    INR 1.4* 1.4* 1.4*     Chemistry  Recent Labs     11/20/18  0345 11/21/18  0424 11/22/18  0441   NA 132* 134* 136*   K 4.2 4.1 3.9   CL 99 99 97*   CO2 24 24 26    BUN 32* 42* 45*   CR 1.26* 1.20 1.16   GFR 55* 58* >60   GLU 91 108* 90   CA 10.0 9.5 9.1   PO4 2.1  --   -- ALBUMIN 2.9* 3.0* 2.9*   ALKPHOS 422* 338* 428*   AST 236* 169* 129*   ALT 343* 294* 223*   TOTBILI 1.7* 2.1* 2.6*       Microbiology, Radiology and other Diagnostics Review   Microbiology data reviewed.    Pertinent radiology images viewed.    7/14 CT abdomen/pelvis:  1. Development of 2 new adjacent hepatic abscess/masses in the cephalad   liver, as described, the larger measuring up to 10 cm. Given prior   clinical history, these likely reflect recurrent abscesses however   underlying hepatic mass such as cholangiocarcinoma could appear similar.  2. Right ureteropelvic junction calculus measuring 6 mm with mild upstream   pelvocaliectasis without frank hydronephrosis. Adjacent urothelial   thickening likely reflects inflammation. Urinalysis is recommended.  3. Persistent small left lower pole nonobstructing renal calculus.  4. Marked sigmoid colon diverticulosis.  5. 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      Theotis Burrow, MD  Division of Infectious Diseases   Pager [phone]

## 2018-11-22 NOTE — Progress Notes
PHYSICAL THERAPY  ASSESSMENT      Name: Tyler Castillo        MRN: [id number]          DOB: November 12, 1934          Age: 84 y.o.  Admission Date: 11/20/2018             LOS: 2 days      Mobility  Patient Turn/Position: Chair  Progressive Mobility Level: Walk in room  Distance Walked (feet): 10 ft  Level of Assistance: Assist X2  Assistive Device: Walker  Time Tolerated: 11-30 minutes  Activity Limited By: Fatigue;Weakness    Subjective  Significant hospital events: Admitted for recurrent liver mass (drain placed 7/15), R nephrolithiasis, and weakness/malaise.  Recent admission to OSH for dehydration (7/9-7/9).  PMH:  PMH, liver mass (10/2017).  Mental / Cognitive Status: Alert;Oriented;Cooperative  Persons Present: Nursing Staff  Pain: Patient has no complaint of pain  Pain Interventions: Patient assisted into position of comfort  Ambulation Assist: Independent Mobility in Community without Device  Patient Owned Equipment: None  Home Situation: Lives Alone  Type of Home: House  Entry Stairs: 1-2 Stairs;Rail on Both Sides  In-Home Stairs: Able to Live on One Level  Comments: Reports fall 2-3 wks. ago but no other incidents in the last year.  Son/DIL nearby and assist with meal prep and community mobility, as needed.    ROM  LE ROM: WFL    Strength  Overall Strength: Generalized Weakness    Bed Mobility/Transfer  Transfer Type: Sit to/from Stand  Transfer: Assistance Level: To/From;Bed Side Chair;Maximal Assist;x2 People  Transfer: Assistive Device: Nurse, adult  Transfers: Type Of Assistance: Requires Extra Time  Comments: Attempted 2 stands with max x1 and was unable to achieve clearance from chair.  Sling placed in chair for use by nursing staff, if needed.    Gait  Gait Distance: 10 feet  Gait: Assistance Level: Moderate Assist  Gait: Assistive Device: Nurse, adult  Gait: Descriptors: Pace: Slow;Forward trunk flexion;Decreased step length  Comments: Demonstrates appropriate foot clearance when marching in place, but then utilizes a shuffling gait pattern during ambulation.  Unable to lengthen steps with cues.    Education  Persons Educated: Patient  Patient Barriers To Learning: None Noted  Topics: Plan/Goals of PT Interventions;Use of Assistive Device/Orthosis;Mobility Progression;Safety Awareness;Up with Assist Only;Importance of Increasing Activity;Positioning;Recommend Continued Therapy    Assessment/Progress  Impaired Mobility Due To: Decreased Strength;Impaired Balance;Deconditioning  Assessment/Progress: Should Improve w/ Continued PT  Comments: Currently requires Ax2 + use of RW, wheresas he is independent at baseline (no device).  Will benefit from ongoing PT to improve strength, mobility, safety for eventual return home.    Goals  Goal Formulation: With Patient  Time For Goal Achievement: 7 days  Patient Will Go Supine To/From Sit: w/ Stand By Assist, Ongoing  Patient Will Transfer Sit to Stand: w/ Stand By Assist, Ongoing  Patient Will Ambulate: Greater than 200 Feet, w/ Stand By Assist, Ongoing(least restrictive device)  Patient Will Go Up / Down Stairs: 1-2 Stairs, w/ Stand By Assist, Ongoing    Plan  Treatment Interventions: Mobility Training;Strengthening;Balance Activities  Plan Frequency: 5 Days per Week  PT Plan for Next Visit: Ax2.  Assess bed mobility; work on sit <--> stand quality and gait with chair-follow.    PT Discharge Recommendations  Recommendation: Inpatient setting  Comments: Needs to be mod I to return home - was not using device prior to admission but currently utilizing RW.  Therapist  Inocencio Homes, PT  Date  11/22/2018

## 2018-11-22 NOTE — Care Plan
Problem: Infection, Risk of  Goal: Absence of infection  Outcome: Goal Ongoing  Flowsheets (Taken 11/20/2018 0523 by Juliane Poot, RN)  Absence of infection:   Assess for infection (Monitor SIRS Criteria)   Monitor for signs and symptoms of infection     Problem: Falls, High Risk of  Goal: Absence of falls-Adult Patient  Outcome: Goal Ongoing  Flowsheets (Taken 11/20/2018 0523 by Juliane Poot, RN)  Absence of falls-Adult Patient:   Complete Fall Risk Assessment.   Provde safe environment.   Consider additional interventions if patient is confused, has gait/balance problems and on high risk medications.   Provide safe ambulation.   Implement fall risk bundle.   Provide fall prevention strategies.     Problem: Discharge Planning  Goal: Participation in plan of care  Outcome: Goal Ongoing  Flowsheets (Taken 11/20/2018 0523 by Juliane Poot, RN)  Participation in Plan of Care: Involve patient/caregiver in care planning decision making  Goal: Knowledge regarding plan of care  Outcome: Goal Ongoing  Flowsheets (Taken 11/20/2018 0523 by Juliane Poot, RN)  Knowledge regarding plan of care:   Provide admission education to parent/caregiver   Provide plan of care education   Provide procedural and treatment education   Provide fall prevention education   Provide infection prevention education   Provide medication management education   Provide VTE signs and symptoms education   Provide pre-operative teaching  Goal: Prepared for discharge  Outcome: Goal Ongoing  Flowsheets (Taken 11/20/2018 0523 by Juliane Poot, RN)  Prepared for discharge:   Complete ADL ability assessment   Provide safe use medical equipment education   Provide discharge activity restrictions education   Provide discharge materials appropriate to patient condition   Provide diet and oral health education   Collaborate with multidisciplinary team for hospital discharge coordination     Problem: Pain  Goal: Management of pain  Outcome: Goal Ongoing Flowsheets (Taken 11/20/2018 0523 by Juliane Poot, RN)  Management of pain:   In the patient who can fully report pain, assess pain characteristics.   In the patient who cannot fully report, observational (behavior) tools are appropriate.   Manage pain.   Assess opioid analgesia side-effects.   Assess pain control barriers.   Complete pain assessment scale according to age, condition and ability to understand.  Goal: Knowledge of pain management  Outcome: Goal Ongoing  Flowsheets (Taken 11/20/2018 0523 by Juliane Poot, RN)  Knowledge of pain management:   Provide pain scale education   Provide pain management methods education   Provide pharmacological pain management education  Goal: Progress Toward Pain Management Goals  Outcome: Goal Ongoing  Flowsheets (Taken 11/20/2018 0523 by Juliane Poot, RN)  Progress toward pain management goals:   Progress toward pain management goals   Assess progress toward pain management goals     Problem: Mobility/Activity Intolerance  Goal: Maximize functional ADL's and mobility outcomes  Outcome: Goal Ongoing  Flowsheets (Taken 11/22/2018 1830)  Maximize functional ADLs and mobility outcomes:   Administer oxygen to maintain SpO2 at approprate levels   Manage environmental safety   Manage energy conservation for mobility/activity intolerance   Maintain body position   Consider consult for mobility/activity intolerance   Physical thrapy evaluation and treatment   Occupational therapy evaulation and treatment

## 2018-11-22 NOTE — Other
Immediate Post Procedure Note    Date:  11/22/2018                                         Attending Physician:   Derrek Gu, MD  Performing Provider:  Abran Cantor, DO    Consent:  Consent obtained from patient.  Time out performed: Consent obtained, correct patient verified, correct procedure verified, correct site verified, patient marked as necessary.  Pre/Post Procedure Diagnosis:  Hepatic abscess  Indications:  Hepatic abscess    Anesthesia: Conscious Sedation  Procedure(s):  CT guided drain placement.   Findings:  Successful CT guided drain placement.Bloody purulent fluid aspirated.      Estimated Blood Loss:  None/Negligible  Specimen(s) Removed/Disposition:  Yes, sent to pathology  Complications: None  Patient Tolerated Procedure: Well  Post-Procedure Condition:  stable    Abran Cantor, DO

## 2018-11-22 NOTE — Progress Notes
Drain placed in IR    [facility] Abscess Drain paperwork filled out by this RN and placed in front of patient physical chart. If drain remains in place at discharge, form to be faxed, along with copy of patient face sheet, to [facility] (fax: [phone]) by case manager. Prior to discharge home, inpatient RN should also notify IR [facility] [phone]) so that drain supplies and saline flush prescription can be delivered to patient for home use.     Drain flushing and site care reviewed with patient and/or family at this time. Pt verbalizes understanding, though may need reinforcement during hospitalization. Written instructions provided and added to patient AVS.

## 2018-11-22 NOTE — Progress Notes
Gastroenterology Consult Note  Patient Name:Tyler Castillo         ZOX:[id number]  Admission Date: 11/20/2018  1:16 AM      Principal Problem:    Liver mass  Active Problems:    Leucocytosis      History of Present Illness/Subjective:  Tyler Castillo is a 84 y.o. male with history of liver abscess versus infected mass, pericarditis s/p pericardectomy in 1993 presented to OSH 7/12 for 1 week of general fatigue, dizziness.  Hepatology was consulted when imaging showed increase in liver lesion previously consistent with abscess.    The patient was seen following his IR procedure.  He is still very groggy after receiving small amounts of procedural sedation.  He did not feel like eating much but did have some fruit today.  Still have some abdominal discomfort but denies any chills or diaphoresis.    Assessment/ Plan:    1. Hepatic masses (abscess vs. infected mass).  MRI 10/27/2017 segment five 9 cm mass.  Underwent percutaneous drainage, drain left in place.  Biopsy 10/26/2017 showed necrosis, mixed inflammatory infiltrate; blood cultures grew fusobacterium and strep (from fluid aspirate adjacent to mass); amebiasis negative.  Drain removed 11/21/2017, negative abscessogram.  Finished ertapenem 12/01/2017, Augmentin finish 12/17/2017.  CT 12/2017 showed decrease in size of abscess measuring 5.8 x 5.1 cm (previously 9.0 x 7.0 cm).   1. Liver Bx 10/26/17: The native liver biopsy shows mild sinusoidal dilation. ???The portal triads show mild neutrophilic inflammation and a bile ductular reaction, consistent with biliary outflow obstruction. ???These findings are often seen adjacent to a mass lesion. ???There is no evidence of chronic or acute hepatitis. ???There is no significant fibrosis.   2. 2nd admission CT: abscess from 10/2017 appeared resolved. 7.2 x 8.9 cm mass in the hepatic dome posterior segment of right hepatic lobe, interval development of 2.9 x 3.0 heterogeneous mass in the left hepatic lobe 3. S/p IR drain placement 7/15 (bloody, purulent fluid)    Recommendations:  Admitted with fatigue, dizziness and found to have leukocytosis with two liver lesions. He had history of liver abscess (now resolved) in 10/2017. Elevated aminotransaminases and bilirubin are persistent.  He does have cholelithiasis but no CBD dilatation on CT scan.  - Plan for EUS/ERCP tomorrow; keep NPO.   -Continue ertapenem, appreciate infectious disease recommendations  -We will follow along culture and sensitivity  -Please obtain colonoscopy report from 2017    Patient seen/discussed with Dr. Livia Snellen Marthenia Rolling, MD  GI/Hepatology Fellow [phone]    -----------------------------  PMH:  Medical History:   Diagnosis Date   ??? Hyperlipidemia    ??? Renal failure        Current medications:  No current facility-administered medications on file prior to encounter.      Current Outpatient Medications on File Prior to Encounter   Medication Sig Dispense Refill   ??? acetaminophen (TYLENOL) 500 mg tablet Take 500 mg by mouth every 6 hours as needed for Pain. Max of 4,000 mg of acetaminophen in 24 hours.     ??? aspirin EC 81 mg tablet Take 81 mg by mouth daily. Take with food.     ??? Calcium Carbonate-Mag Hydroxid (ROLAIDS) 550-110 mg chew Chew 1-2 tablets by mouth as Needed.     ??? diphenhydrAMINE HCL (ZZZQUIL) 50 mg/30 mL liqd Take 10 mL by mouth at bedtime as needed.     ??? vitamins, multiple cap Take 1 capsule by mouth daily.  PSH:  Surgical History:   Procedure Laterality Date   ??? ESOPHAGOGASTRODUODENOSCOPY WITH CONTROL OF BLEEDING - FLEXIBLE N/A 10/31/2017    Performed by Buckles, Vinnie Level, MD at [facility]   ??? HX APPENDECTOMY     ??? LUNG SURGERY         SH:  Social History     Socioeconomic History   ??? Marital status: Married     Spouse name: Not on file   ??? Number of children: 1   ??? Years of education: Not on file   ??? Highest education level: Not on file   Occupational History   ??? Not on file   Tobacco Use ??? Smoking status: Former Smoker     Packs/day: 0.50     Years: 20.00     Pack years: 10.00     Types: Cigarettes     Last attempt to quit: 10/24/1977     Years since quitting: 41.1   ??? Smokeless tobacco: Never Used   Substance and Sexual Activity   ??? Alcohol use: Yes   ??? Drug use: Never   ??? Sexual activity: Not on file   Other Topics Concern   ??? Not on file   Social History Narrative   ??? Not on file       FH:  Family History   Problem Relation Age of Onset   ??? Stroke Mother        Review of Systems:  Constitutional: No fevers, chills, weight loss  Eyes: No vision deficit, no icterus  Ears, nose, mouth: No oral bleeding, ulcer  Cardiovascular: No chest pain, palpitations  Respiratory: No dyspnea, cough   Gastrointestinal: No abdominal pain, blood in stool +flank pain  Musculoskeltal: No joint inflammation, new deformity  Integumentary: No rashes, exudate  Neurologic: No cognitive change, focal weakness  Hematologic: No for easy bleeding or bruising  Please see HPI for additional pertinent documentation    Physical Exam:  Vitals:    11/22/18 0915 11/22/18 0930 11/22/18 1010 11/22/18 1149   BP: (!) 152/65 139/66 (!) 152/64 (!) 151/57   BP Source:   Arm, Left Upper Arm, Left Upper   Pulse: 72 65 70    Temp:   36.3 ???C (97.4 ???F)    SpO2: 97% 96% 97% 96%   Weight:       Height:         Constitutional- Vitals above, no acute distress.   Head - Normocephalic, atraumatic.   Eyes -  EOMI grossly. No icterus or injection.   Ears, nose, mouth, throat- No oral ulcer or bleeding.   Neck - No swelling or tracheal deviation.   Respiratory - Symmetric chest rise, no increased work of breathing.  Cardiovascular - Peripheral pulses intact, no pedal edema.  Gastrointestinal- Non-TTP. No hepatospenomegaly.   Skin - No exposed rash, lesion.  Neurologic - No CN deficit, normal fluid speech  Psychiatric - Judgement intact, thought content appropriate.    Labs/Imaging:  Pertient labs/imaging were reviewed on initiation of progress note.

## 2018-11-23 ENCOUNTER — Encounter: Admit: 2018-11-23 | Discharge: 2018-11-24

## 2018-11-23 ENCOUNTER — Encounter: Admit: 2018-11-23 | Discharge: 2018-11-23

## 2018-11-23 DIAGNOSIS — N19 Unspecified kidney failure: Secondary | ICD-10-CM

## 2018-11-23 DIAGNOSIS — E785 Hyperlipidemia, unspecified: Secondary | ICD-10-CM

## 2018-11-23 LAB — COMPREHENSIVE METABOLIC PANEL
Lab: 1 mg/dL — ABNORMAL HIGH (ref >60)
Lab: 135 MMOL/L — ABNORMAL LOW (ref 137–147)

## 2018-11-23 LAB — POC GLUCOSE: Lab: 86 mg/dL (ref 70–100)

## 2018-11-23 LAB — PROTIME INR (PT): Lab: 1.5 MMOL/L — ABNORMAL HIGH (ref >60)

## 2018-11-23 LAB — GRAM STAIN

## 2018-11-23 LAB — CBC AND DIFF: Lab: 23 K/UL — ABNORMAL HIGH (ref >60)

## 2018-11-23 MED ORDER — LIDOCAINE (PF) 200 MG/10 ML (2 %) IJ SYRG
0 refills | Status: DC
Start: 2018-11-23 — End: 2018-11-24
  Administered 2018-11-23: 23:00:00 40 mg via INTRAVENOUS

## 2018-11-23 MED ORDER — FLUMAZENIL 0.1 MG/ML IV SOLN
.2 mg | INTRAVENOUS | 0 refills | Status: DC | PRN
Start: 2018-11-23 — End: 2018-11-29

## 2018-11-23 MED ORDER — SODIUM CHLORIDE 0.9 % IV SOLP
Freq: Once | INTRAVENOUS | 0 refills | Status: CP
Start: 2018-11-23 — End: ?
  Administered 2018-11-23: 22:00:00 1000.000 mL via INTRAVENOUS

## 2018-11-23 MED ORDER — SODIUM CHLORIDE 0.9 % IR SOLN
0 refills | Status: DC
Start: 2018-11-23 — End: 2018-11-24
  Administered 2018-11-24: 1000 mL

## 2018-11-23 MED ORDER — FENTANYL CITRATE (PF) 50 MCG/ML IJ SOLN
0 refills | Status: CP
  Administered 2018-11-23: 16:00:00 50 ug via INTRAVENOUS

## 2018-11-23 MED ORDER — MIDAZOLAM 1 MG/ML IJ SOLN
1 mg | Freq: Once | INTRAVENOUS | 0 refills | Status: CP
Start: 2018-11-23 — End: ?
  Administered 2018-11-23: 16:00:00 0.5 mg via INTRAVENOUS

## 2018-11-23 MED ORDER — SODIUM CHLORIDE 0.9 % IV SOLP
0 refills | Status: DC
Start: 2018-11-23 — End: 2018-11-24
  Administered 2018-11-23: 23:00:00 via INTRAVENOUS

## 2018-11-23 MED ORDER — ONDANSETRON HCL (PF) 4 MG/2 ML IJ SOLN
INTRAVENOUS | 0 refills | Status: DC
Start: 2018-11-23 — End: 2018-11-24
  Administered 2018-11-24: 4 mg via INTRAVENOUS

## 2018-11-23 MED ORDER — SODIUM CHLORIDE 0.9 % IV SOLP
1000 mL | Freq: Once | INTRAVENOUS | 0 refills | Status: AC
Start: 2018-11-23 — End: ?

## 2018-11-23 MED ORDER — NALOXONE 0.4 MG/ML IJ SOLN
.08 mg | INTRAVENOUS | 0 refills | Status: DC | PRN
Start: 2018-11-23 — End: 2018-11-29

## 2018-11-23 MED ORDER — FENTANYL CITRATE (PF) 50 MCG/ML IJ SOLN
0 refills | Status: DC
Start: 2018-11-23 — End: 2018-11-24
  Administered 2018-11-23 (×2): 25 ug via INTRAVENOUS
  Administered 2018-11-23: 23:00:00 50 ug via INTRAVENOUS

## 2018-11-23 MED ORDER — SUCCINYLCHOLINE CHLORIDE 20 MG/ML IJ SOLN
INTRAVENOUS | 0 refills | Status: DC
Start: 2018-11-23 — End: 2018-11-24
  Administered 2018-11-23: 23:00:00 100 mg via INTRAVENOUS

## 2018-11-23 MED ORDER — IOPAMIDOL 61 % IV SOLN
0 refills | Status: DC
Start: 2018-11-23 — End: 2018-11-24
  Administered 2018-11-24: 20 mL via INTRAMUSCULAR

## 2018-11-23 MED ORDER — PROPOFOL INJ 10 MG/ML IV VIAL
0 refills | Status: DC
Start: 2018-11-23 — End: 2018-11-24
  Administered 2018-11-23: 23:00:00 60 mg via INTRAVENOUS

## 2018-11-23 MED ORDER — DEXTRAN 70-HYPROMELLOSE (PF) 0.1-0.3 % OP DPET
0 refills | Status: DC
Start: 2018-11-23 — End: 2018-11-24
  Administered 2018-11-23: 23:00:00 1 [drp] via OPHTHALMIC

## 2018-11-23 MED ADMIN — WATER FOR INJECTION, STERILE IJ SOLN [79513]: 10 mL | INTRAVENOUS | @ 14:00:00 | Stop: 2018-11-23 | NDC 00409488723

## 2018-11-23 NOTE — Care Plan
Problem: Infection, Risk of  Goal: Absence of infection  Outcome: Goal Ongoing  Flowsheets (Taken 11/23/2018 1033)  Absence of infection:   Assess for infection (Monitor SIRS Criteria)   Administer pharmacological therapies as ordered   Monitor for signs and symptoms of infection   Implement prevention measures as indicated     Problem: Falls, High Risk of  Goal: Absence of falls-Adult Patient  Outcome: Goal Ongoing  Flowsheets (Taken 11/20/2018 0523 by Juliane Poot, RN)  Absence of falls-Adult Patient:   Complete Fall Risk Assessment.   Provde safe environment.   Consider additional interventions if patient is confused, has gait/balance problems and on high risk medications.   Provide safe ambulation.   Implement fall risk bundle.   Provide fall prevention strategies.     Problem: Discharge Planning  Goal: Participation in plan of care  Outcome: Goal Ongoing  Flowsheets (Taken 11/20/2018 0523 by Juliane Poot, RN)  Participation in Plan of Care: Involve patient/caregiver in care planning decision making  Goal: Knowledge regarding plan of care  Outcome: Goal Ongoing  Flowsheets (Taken 11/20/2018 0523 by Juliane Poot, RN)  Knowledge regarding plan of care:   Provide admission education to parent/caregiver   Provide plan of care education   Provide procedural and treatment education   Provide fall prevention education   Provide infection prevention education   Provide medication management education   Provide VTE signs and symptoms education   Provide pre-operative teaching  Goal: Prepared for discharge  Outcome: Goal Ongoing  Flowsheets (Taken 11/20/2018 0523 by Juliane Poot, RN)  Prepared for discharge:   Complete ADL ability assessment   Provide safe use medical equipment education   Provide discharge activity restrictions education   Provide discharge materials appropriate to patient condition   Provide diet and oral health education   Collaborate with multidisciplinary team for hospital discharge coordination Problem: Pain  Goal: Management of pain  Outcome: Goal Ongoing  Flowsheets (Taken 11/20/2018 0523 by Juliane Poot, RN)  Management of pain:   In the patient who can fully report pain, assess pain characteristics.   In the patient who cannot fully report, observational (behavior) tools are appropriate.   Manage pain.   Assess opioid analgesia side-effects.   Assess pain control barriers.   Complete pain assessment scale according to age, condition and ability to understand.  Goal: Knowledge of pain management  Outcome: Goal Ongoing  Flowsheets (Taken 11/20/2018 0523 by Juliane Poot, RN)  Knowledge of pain management:   Provide pain scale education   Provide pain management methods education   Provide pharmacological pain management education  Goal: Progress Toward Pain Management Goals  Outcome: Goal Ongoing  Flowsheets (Taken 11/20/2018 0523 by Juliane Poot, RN)  Progress toward pain management goals:   Progress toward pain management goals   Assess progress toward pain management goals     Problem: Mobility/Activity Intolerance  Goal: Maximize functional ADL's and mobility outcomes  Outcome: Goal Ongoing  Flowsheets (Taken 11/22/2018 1830)  Maximize functional ADLs and mobility outcomes:   Administer oxygen to maintain SpO2 at approprate levels   Manage environmental safety   Manage energy conservation for mobility/activity intolerance   Maintain body position   Consider consult for mobility/activity intolerance   Physical thrapy evaluation and treatment   Occupational therapy evaulation and treatment     Problem: Self-Care Deficit  Goal: Maximize ADL functioning  Outcome: Goal Ongoing  Note: PT/OT ordered and working with pt.  Plan to dc to rehab or SNF.

## 2018-11-23 NOTE — Med Student Progress Note
Medical Student Progress Note -  Inpatient    NAME: Tyler Castillo                                     MRN: [id number]                 DOB: 1934/10/21          AGE: 84 y.o.  ADMISSION DATE: 11/20/2018             DAYS ADMITTED: LOS: 3 days      Subjective: Tyler Castillo is a 84 year old male with a PMH of HLD and right hepatic mass that was admitted 2 days ago for malaise, leukocytosis, worsening right/new left hepatic masses, and nephrolithiasis/hydronephrosis. Patient was undergoing procedure. Unable to interview patient.     Scheduled Meds:ertapenem (INVanz) IVP 1 g, 1 g, Intravenous, Q24H*  lidocaine (LIDODERM) 5 % topical patch 1 patch, 1 patch, Topical, QDAY    And  Verification of Patch Placement and Integrity - Lidocaine 5%, , Transdermal, BID  phytonadione (VITAMIN K) 10 mg in dextrose 5% (D5W) 50 mL IVPB, 10 mg, Intravenous, QDAY  polyethylene glycol 3350 (MIRALAX) packet 17 g, 1 packet, Oral, QDAY  senna/docusate (SENOKOT-S) tablet 1 tablet, 1 tablet, Oral, BID  sodium chloride PF 0.9% syringe 10 mL, 10 mL, Intravenous, Q8H*    Continuous Infusions:  ??? lactated ringers infusion 500 mL (11/21/18 2019)     PRN and Respiratory Meds:acetaminophen/lidocaine/antacid DS(#) Q4H PRN, fentaNYL citrate PF Q2H PRN, phenol PRN, prochlorperazine Q6H PRN    ROS: Unable to interview patient    Objective:    Physical Exam: Unable to examine patient    Results for orders placed or performed during the hospital encounter of 11/20/18 (from the past 48 hour(s))   CBC AND DIFF    Collection Time: 11/22/18  4:41 AM   # # Low-High    White Blood Cells 22.1 (H) 4.5 - 11.0 K/UL    RBC 3.84 (L) 4.4 - 5.5 M/UL    Hemoglobin 12.7 (L) 13.5 - 16.5 GM/DL    Hematocrit 45.4 (L) 40 - 50 %    MCV 97.3 80 - 100 FL    MCH 33.2 26 - 34 PG    MCHC 34.1 32.0 - 36.0 G/DL    RDW 09.8 11 - 15 %    Platelet Count 178 150 - 400 K/UL    MPV 9.6 7 - 11 FL    Neutrophils 89 (H) 41 - 77 %    Lymphocytes 5 (L) 24 - 44 % Monocytes 6 4 - 12 %    Eosinophils 0 0 - 5 %    Basophils 0 0 - 2 %    Absolute Neutrophil Count 19.60 (H) 1.8 - 7.0 K/UL    Absolute Lymph Count 1.10 1.0 - 4.8 K/UL    Absolute Monocyte Count 1.40 (H) 0 - 0.80 K/UL    Absolute Eosinophil Count 0.00 0 - 0.45 K/UL    Absolute Basophil Count 0.00 0 - 0.20 K/UL   COMPREHENSIVE METABOLIC PANEL    Collection Time: 11/22/18  4:41 AM   # # Low-High    Sodium 136 (L) 137 - 147 MMOL/L    Potassium 3.9 3.5 - 5.1 MMOL/L    Chloride 97 (L) 98 - 110 MMOL/L    Glucose 90 70 - 100 MG/DL    Blood Urea  Nitrogen 45 (H) 7 - 25 MG/DL    Creatinine 1.61 0.4 - 1.24 MG/DL    Calcium 9.1 8.5 - 09.6 MG/DL    Total Protein 6.5 6.0 - 8.0 G/DL    Total Bilirubin 2.6 (H) 0.3 - 1.2 MG/DL    Albumin 2.9 (L) 3.5 - 5.0 G/DL    Alk Phosphatase 045 (H) 25 - 110 U/L    AST (SGOT) 129 (H) 7 - 40 U/L    CO2 26 21 - 30 MMOL/L    ALT (SGPT) 223 (H) 7 - 56 U/L    Anion Gap 13 (H) 3 - 12    eGFR Non African American >60 >60 mL/min    eGFR African American >60 >60 mL/min   PROTIME INR (PT)    Collection Time: 11/22/18  4:41 AM   # # Low-High    INR 1.4 (H) 0.8 - 1.2   CULTURE-WOUND/TISSUE/FLUID(AEROBIC ONLY)W/SENSITIVITY    Collection Time: 11/22/18  8:48 AM   # # Low-High    Battery Name ROUTINE CULTURE     Specimen Description MISC FLUID  HEPATIC MASS       Special Requests NONE     Direct Gram Stain MANY  NEUTROPHILS  NO ORGANISMS SEEN       Culture      Report Status     GRAM STAIN    Collection Time: 11/22/18  8:48 AM   # # Low-High    Battery Name GRAM STAIN      Specimen Description MISC FLUID  HEPATIC MASS       Special Requests NONE     Gram Stain MANY  NEUTROPHILS  NO ORGANISMS SEEN       Report Status FINAL  11/22/2018      CBC AND DIFF    Collection Time: 11/23/18  4:56 AM   # # Low-High    White Blood Cells 23.6 (H) 4.5 - 11.0 K/UL    RBC 3.35 (L) 4.4 - 5.5 M/UL    Hemoglobin 11.3 (L) 13.5 - 16.5 GM/DL    Hematocrit 40.9 (L) 40 - 50 %    MCV 96.0 80 - 100 FL    MCH 33.7 26 - 34 PG MCHC 35.1 32.0 - 36.0 G/DL    RDW 81.1 11 - 15 %    Platelet Count 173 150 - 400 K/UL    MPV 9.0 7 - 11 FL    Neutrophils 93 (H) 41 - 77 %    Lymphocytes 4 (L) 24 - 44 %    Monocytes 3 (L) 4 - 12 %    Eosinophils 0 0 - 5 %    Basophils 0 0 - 2 %    Absolute Neutrophil Count 21.95 (H) 1.8 - 7.0 K/UL    Absolute Lymph Count 0.91 (L) 1.0 - 4.8 K/UL    Absolute Monocyte Count 0.76 0 - 0.80 K/UL    Absolute Eosinophil Count 0.01 0 - 0.45 K/UL    Absolute Basophil Count 0.00 0 - 0.20 K/UL   COMPREHENSIVE METABOLIC PANEL    Collection Time: 11/23/18  4:56 AM   # # Low-High    Sodium 135 (L) 137 - 147 MMOL/L    Potassium 3.7 3.5 - 5.1 MMOL/L    Chloride 99 98 - 110 MMOL/L    Glucose 109 (H) 70 - 100 MG/DL    Blood Urea Nitrogen 48 (H) 7 - 25 MG/DL    Creatinine 9.14 0.4 - 1.24 MG/DL  Calcium 8.3 (L) 8.5 - 10.6 MG/DL    Total Protein 5.4 (L) 6.0 - 8.0 G/DL    Total Bilirubin 2.7 (H) 0.3 - 1.2 MG/DL    Albumin 2.5 (L) 3.5 - 5.0 G/DL    Alk Phosphatase 604 (H) 25 - 110 U/L    AST (SGOT) 99 (H) 7 - 40 U/L    CO2 27 21 - 30 MMOL/L    ALT (SGPT) 162 (H) 7 - 56 U/L    Anion Gap 9 3 - 12    eGFR Non African American >60 >60 mL/min    eGFR African American >60 >60 mL/min   PROTIME INR (PT)    Collection Time: 11/23/18  4:56 AM   # # Low-High    INR 1.5 (H) 0.8 - 1.2       Assessment/Plan: Tyler Castillo is a 84 year old male with a PMH of HLD and right hepatic mass that was admitted 2 days ago for malaise, leukocytosis, worsening right/new left hepatic masses, and nephrolithiasis/hydronephrosis.     Right and Left Hepatic Masses  - History of right hepatic mass since 2019  - Interval enlargement of right hepatic mass  - Left hepatic mass found on CT (11/19/18)  - Leukocytosis on admission (11/19/18)   - Continue Ertapenem IV 1g   - IR performing Hepatic Abscess I & D with biopsy today    Nephrolithiasis  Hydronephrosis   - 9 mm right UDJ renal calculus with hydronephrosis on CT (11/19/18) - Urology recommendation of non-surgical management (11/20/18)   - Continue to monitor renal function     Back Pain   - Continue Lidocaine patch PRN    Gastric Reflux  - Epigastric/chest pain consistent with gastric reflux (11/20/18)   - Continue GI Cocktail Q4H PRN    Rehabilitation    - PT Consult

## 2018-11-23 NOTE — Anesthesia Pre-Procedure Evaluation
Anesthesia Pre-Procedure Evaluation    Name: Tyler Castillo      MRN: [id number]     DOB: [birth year]     Age: 84 y.o.     Sex: male   _________________________________________________________________________     Procedure Date: 11/23/2018   Procedure: Procedure(s):  ENDOSCOPIC RETROGRADE CHOLANGIOPANCREATOGRAPHY  ESOPHAGOGASTRODUODENOSCOPY WITH ENDOSCOPIC ULTRASOUND EXAMINATION - FLEXIBLE     Physical Assessment  Vital Signs (last filed in past 24 hours):  BP: 138/62 (07/16 1700)  Temp: 36.7 ???C (98.1 ???F) (07/16 1700)  Pulse: 83 (07/16 1700)  Respirations: 28 PER MINUTE (07/16 1700)  SpO2: 97 % (07/16 1700)      Patient History  No Known Allergies     Current Medications    Medication Directions   acetaminophen (TYLENOL) 500 mg tablet Take 500 mg by mouth every 6 hours as needed for Pain. Max of 4,000 mg of acetaminophen in 24 hours.   aspirin EC 81 mg tablet Take 81 mg by mouth daily. Take with food.   Calcium Carbonate-Mag Hydroxid (ROLAIDS) 550-110 mg chew Chew 1-2 tablets by mouth as Needed.   diphenhydrAMINE HCL (ZZZQUIL) 50 mg/30 mL liqd Take 10 mL by mouth at bedtime as needed.   vitamins, multiple cap Take 1 capsule by mouth daily.         Review of Systems/Medical History      Patient summary reviewed  Nursing notes reviewed  Pertinent labs reviewed    PONV Screening: Non-smoker  No history of anesthetic complications  No family history of anesthetic complications      Airway - negative        Prior tracheostomy      Pulmonary           Shortness of breath      Home oxygen: on supplemental O2 via [place].      cxr w/ pulm edema and small pleural effusions.        Cardiovascular         Exercise tolerance: <4 METS      Beta Blocker therapy: No      No hypertension,       No past MI,       No dysrhythmias      Hyperlipidemia (Statin)      GI/Hepatic/Renal         Liver disease (liver mass vs abcess s/p ir biopsy)     No renal disease      Neuro/Psych - negative      No seizures      No CVA Musculoskeletal - negative        Endocrine/Other - negative      No diabetes      No hypothyroidism   Physical Exam    Airway Findings      Mallampati: II      TM distance: >3 FB      Neck ROM: full      Mouth opening: good      Airway patency: adequate    Dental Findings:       Upper dentures, lower dentures and poor dentition    Cardiovascular Findings:       Rhythm: regular      Rate: normal      No murmur    Pulmonary Findings:    Decreased breath sounds. No rhonchi or no wheezes.    Neurological Findings:       Alert and oriented x 3    Normal mental status  Constitutional findings:       No acute distress      Well-developed       Diagnostic Tests  Hematology:   Lab Results   Component Value Date    HGB 11.3 11/23/2018    HCT 32.2 11/23/2018    PLTCT 173 11/23/2018    WBC 23.6 11/23/2018    NEUT 93 11/23/2018    ANC 21.95 11/23/2018    LYMPH 1 11/20/2018    ALC 0.91 11/23/2018    MONA 3 11/23/2018    AMC 0.76 11/23/2018    EOSA 0 11/23/2018    ABC 0.00 11/23/2018    MCV 96.0 11/23/2018    MCH 33.7 11/23/2018    MCHC 35.1 11/23/2018    MPV 9.0 11/23/2018    RDW 13.7 11/23/2018         General Chemistry:   Lab Results   Component Value Date    NA 135 11/23/2018    K 3.7 11/23/2018    CL 99 11/23/2018    CO2 27 11/23/2018    GAP 9 11/23/2018    BUN 48 11/23/2018    CR 1.02 11/23/2018    GLU 109 11/23/2018    GLU 111 12/12/2017    CA 8.3 11/23/2018    ALBUMIN 2.5 11/23/2018    LACTIC 1.7 10/24/2017    MG 1.9 11/20/2018    TOTBILI 2.7 11/23/2018    PO4 2.1 11/20/2018      Coagulation:   Lab Results   Component Value Date    PTT 24.7 11/20/2018    INR 1.5 11/23/2018         Anesthesia Plan    ASA score: 3   Plan: general  Induction method: intravenous  NPO status: acceptable      Informed Consent  Anesthetic plan and risks discussed with patient.  Use of blood products discussed with patient      Plan discussed with: anesthesiologist and CRNA. Comments: (Plan for GETA.  Risks discussed in detail.  Consent signed. )

## 2018-11-23 NOTE — Consults
Interventional Radiology Consult Note with Pre-procedural History and Physical      Admission Date: 11/20/2018  LOS: 3 days                       Principal Problem:    Liver mass  Active Problems:    Leucocytosis      Reason for consult: Evaluation for hepatic drain placement and aspiration    Assessment:   - Admitted with re-demonstration of intrahepatic masses with lethargy and leukocytosis  - Imaging reviewed with Carlis Stable, MD and right hepatic lesion is amendable for aspiration and drain placement. Biopsy of lesions will be attempted if viable  - Mr. Grahmann is alert and reports mild abdominal pain   - Labs, medications, and allergies meet procedural protocol.   - Pt is not on a therapeutic blood thinner  -   Platelet Count   Date Value Ref Range Status   11/23/2018 173 150 - 400 K/UL Final   ;   INR   Date Value Ref Range Status   11/23/2018 1.5 (H) 0.8 - 1.2 Final       Plan:  - This case has been reviewed and added onto the [facility] schedule for today .   - For sedation purposes, please keep NPO prior to procedure. May take PO medications with sips of water.    We appreciate being able to participate in this patient's care. Please page with any questions or concerns.    Philmore Pali, APRN-NP   Pgr [phone]    IR Team Pager [phone] (After-hours and Weekends)  __________________________________________________________________      Procedure: Right hepatic aspiration and drain placement    IR Pre Procedure Notes:  Consent in pre  __________________________________________________________________    Chief Complaint:  Hepatic lesion    Previous Anesthetic/Sedation History:  Reviewed.    History of present illness:  Tyler Castillo is a 84 y.o. male patient with hx of hepatic mass, hyperlipidemia admitted with leukocytosis, lethargy and increasing hepatic mass size. IR consulted for left hepatic lesion aspiration. See ROS below for current symptoms.    Review of Systems Constitutional: negative for fevers and chills  Respiratory: negative, negative for increased work of breathing  Gastrointestinal: negative for nausea and vomiting    Medications  Scheduled Meds:ertapenem (INVanz) IVP 1 g, 1 g, Intravenous, Q24H*  [MAR Hold] lidocaine (LIDODERM) 5 % topical patch 1 patch, 1 patch, Topical, QDAY    And  [MAR Hold] Verification of Patch Placement and Integrity - Lidocaine 5%, , Transdermal, BID  phytonadione (VITAMIN K) 10 mg in dextrose 5% (D5W) 50 mL IVPB, 10 mg, Intravenous, QDAY  [MAR Hold] polyethylene glycol 3350 (MIRALAX) packet 17 g, 1 packet, Oral, QDAY  [MAR Hold] senna/docusate (SENOKOT-S) tablet 1 tablet, 1 tablet, Oral, BID  [MAR Hold] sodium chloride PF 0.9% syringe 10 mL, 10 mL, Intravenous, Q8H*    Continuous Infusions:  ??? lactated ringers infusion 500 mL (11/21/18 2019)     PRN and Respiratory Meds:[MAR Hold] acetaminophen/lidocaine/antacid DS(#) Q4H PRN, [MAR Hold] fentaNYL citrate PF Q2H PRN, [MAR Hold] phenol PRN, [MAR Hold] prochlorperazine Q6H PRN      Objective                       Vital Signs: Last Filed                 Vital Signs: 24 Hour Range   BP: 149/58 (07/16  0800)  Temp: 36.6 ???C (97.9 ???F) (07/16 0800)  Pulse: 60 (07/16 0800)  Respirations: 18 PER MINUTE (07/16 0800)  SpO2: 98 % (07/16 0800) BP: (139-152)/(53-69)   Temp:  [36.3 ???C (97.4 ???F)-36.9 ???C (98.5 ???F)]   Pulse:  [60-84]   Respirations:  [16 PER MINUTE-18 PER MINUTE]   SpO2:  [96 %-99 %]    Intensity Pain Scale (Self Report): Asleep (11/23/18 0404) Vitals:    11/20/18 0132   Weight: 83.9 kg (185 lb)         Intake/Output Summary:  (Last 24 hours)    Intake/Output Summary (Last 24 hours) at 11/23/2018 0945  Last data filed at 11/23/2018 0800  Gross per 24 hour   Intake 250 ml   Output 1110 ml   Net -860 ml      Stool Occurrence: 0    Physical Exam  General appearance: alert and no distress  Neurologic: Grossly normal, at baseline  Lungs: Nonlabored with normal effort  Abdomen: soft, non-tender. Airway:  airway assessment performed  Mallampati III (soft palate, base of uvula visible)   Anesthesia Classification:  ASA III (A patient with a severe systemic disease that limits activity, but is not incapacitating)  Pre procedure anxiolysis plan: Midazolam  Intra-procedural Sedation/Medication Plan: Fentanyl, Lidocaine and Midazolam  Personal history of sedation complications: Denies adverse event.   Family history of sedation complications: Denies adverse event.   Medications for Reversal: Naloxone and Flumazenil  Discussion/Reviews:  Physician has discussed risks and alternatives of this type of sedation and above planned procedures with patient  NPO Status: Acceptable   Pregnancy Status: Not Pregnant    Lab/Radiology/Other Diagnostic Tests:  Labs:    Hematology:    Lab Results   Component Value Date    HGB 11.3 11/23/2018    HCT 32.2 11/23/2018    PLTCT 173 11/23/2018    WBC 23.6 11/23/2018    NEUT 93 11/23/2018    ANC 21.95 11/23/2018    LYMPH 1 11/20/2018    ALC 0.91 11/23/2018    MONA 3 11/23/2018    AMC 0.76 11/23/2018    ABC 0.00 11/23/2018    MCV 96.0 11/23/2018    MCHC 35.1 11/23/2018    MPV 9.0 11/23/2018    RDW 13.7 11/23/2018   , Coagulation:    Lab Results   Component Value Date    PTT 24.7 11/20/2018    INR 1.5 11/23/2018    and General Chemistry:    Lab Results   Component Value Date    NA 135 11/23/2018    K 3.7 11/23/2018    CL 99 11/23/2018    GAP 9 11/23/2018    BUN 48 11/23/2018    CR 1.02 11/23/2018    GLU 109 11/23/2018    GLU 111 12/12/2017    CA 8.3 11/23/2018    ALBUMIN 2.5 11/23/2018    LACTIC 1.7 10/24/2017    MG 1.9 11/20/2018    TOTBILI 2.7 11/23/2018     Radiology: Reviewed.

## 2018-11-23 NOTE — Progress Notes
OCCUPATIONAL THERAPY  ASSESSMENT NOTE    Name: Tyler Castillo        MRN: [id number]          DOB: 1935/03/22          Age: 84 y.o.  Admission Date: 11/20/2018             LOS: 3 days    Mobility  Patient Turn/Position: Chair  Progressive Mobility Level: Active transfer to chair  Level of Assistance: Assist X1  Assistive Device: Hand Held  Time Tolerated: 0-10 minutes  Activity Limited By: Weakness    Subjective  Pertinent Dx per Physician: PMH-hepatic mass, hyperlipidemia  who presents with  leukocytosis, lethargy, increasing hepatic mass size. Found to have abcess. Right neph tube placed 7/15 and left to be placed 7/16.  Precautions: Falls  Pain / Complaints: Patient agrees to participate in therapy;Patient demonstrates nonverbal signs of pain    Objective  Psychosocial Status: Willing and Cooperative to Participate    Home Living  Type of Home: House    Prior Function  Level Of Independence: Independent with ADLs and functional transfers;Independent with homemaking w/ ambulation  Lives With: Alone  Receives Help From: None Needed    ADL's  Where Assessed: Chair  Grooming Assist: Stand By Assist  Grooming Deficits: Setup;Increased Time To Complete(seated position)    ADL Mobility  Bed Mobility: Supine to Sit: Standby assist  Transfer Type: Stand pivot  Transfer: Assistance Level: From;Bed;To;Bedside chair;Moderate assist  Transfer: Assistive Device: Hand hold assist  Transfer: Type of Assistance: For balance;For safety considerations;For strength deficit;Requires extra time  End of Activity Status: Up in chair;Nursing notified;Instructed patient to use call light  Sitting Balance: Static sitting balance;1 UE support    Cognition  Overall Cognitive Status: WFL to Adequately Complete Self Care Tasks Safely  Comprehension: Hard of Hearing  Expression: WFL Adequate to Meet Daily Needs  Social Interaction: Interacts in a Network engineer    Education  Persons Educated: Patient Barriers To Learning: Decreased Hearing  Interventions: Repetition of Instructions  Teaching Methods: Verbal Instruction  Patient Response: Verbalized Understanding  Topics: Role of OT, Goals for Therapy  Goal Formulation: With Patient    Assessment  Assessment: Decreased ADL Status;Decreased Safe/Judg during ADL;Decreased Endurance;Decreased Self-Care Trans;Decreased High-Level ADLs  Prognosis: Good;w/Cont OT s/p Acute Discharge  Goal Formulation: Patient  Comments: Patient limited by pain and weakness. Will need ongoing therapeutic intervention to gain strength in order to return home modified independent.    AM-PAC 6 Clicks Daily Activity Inpatient  Putting on and taking off regular lower body clothes?: A Lot  Bathing (Including washing, rinsing, drying): A Lot  Toileting, which includes using toilet, bedpan, or urinal: A Lot  Putting on and taking off regular upper body clothing: A Little  Taking care of personal grooming such as brushing teeth: A Little  Eating meals?: None  Daily Activity Raw Score: 16  Standardized (t-scale) score: 35.96  CMS 0-100% Score: 53.32  CMS G Code Modifier: CK    Plan  Progress: Progressing Toward Goals  OT Frequency: 3-5x/week  OT Plan for Next Visit: Progress transfers and work on toilet with B[facility].    ADL Goals  Patient Will Perform Grooming: Standing at Sink;w/ Minimum Assist  Patient Will Perform Toileting: w/ Bedside Commode;w/ Moderate Assist    Functional Transfer Goals  Pt Will Perform All Functional Transfers: Minimum Assist    OT Discharge Recommendations  Recommendation: Inpatient setting  Patient Currently Requires Physical Assist With: All  mobility;All personal care ADLs;All home functioning ADLs    Therapist: Azucena Kuba, OTR/L  Date: 11/23/2018

## 2018-11-23 NOTE — Progress Notes
Infectious Disease Progress Note    Today's Date:  11/23/2018  Admission Date: 11/20/2018    Reason for this consultation: Patient with history of hepatic mass of unclear etiology with prior cultures growing GPC resembling strep. Treated with ertapenem. Patient presenting with new similar appearing lesion. Please assist with antibiotic recommendations.    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 No viable tumor cells are present. ???The finding may represent reaction adjacent to mass lesion, or may represent a liver abscess.  -10/26/17 normal liver biopsy: Mild sinusoidal dilation and changes consistent with biliary obstruction. No significant fibrosis.   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 Review of outside CT by IR - felt significantly improved and drain could be removed   -11/21/17 IR drain removal. Abscessogram- no fistula -12/01/17 finished ertapenem and started amox/clav, which he finished ~12/17/17  -12/22/17 another 14-day course of augmentin prescribed  -11/19/18 patient presented to [facility] ED with weakness, malaise, right-sided chest discomfort  -11/19/18 CT abdomen/pelvis: 7.2 x 8.9 cm heterogenous mass within hepatic dome posterior segment of R hepatic lobe; increased in size from prior study (July 19); interval development of 2.9x 3.0cm heterogenous mass within L hepatic lobe; interval development of mild (R) sided hydronephrosis secondary to 9mm calculus of right UPJ  -7/12 CXR: Cardiomegaly, chronic small right pleural effusion, right basilar atelectasis  -Leukocytosis and elevated liver enzymes  -7/14 CT abdomen and pelvis: There is persistent cholelithiasis. The hepatic abscess previously demonstrated in the caudal right lobe adjacent to the gallbladder is not distinctly visualized and has likely resolved. Development of 2 new adjacent hepatic low-density lesions in the cephalad liver. The larger is centered in hepatic segments 7/8 and measures approximately 10 x 7 x 9 cm (axial image 17 and coronal image 87). The adjacent smaller abscess is within hepatic segment 4A and measures approximately 5 x 3.5 x 4.5 cm (axial image 15 and coronal image 50).  -7/15 IR placed a drain in the smaller left-sided hepatic abscess; 20 mL of bloody purulent fluid was aspirated.  No biopsy done and no drain placed in the large right-sided hepatic abscess  -7/15 smaller left-sided hepatic abscess fluid GS: Many PMN, no org; culture in progress  ???  Right hydronephrosis  Right UPJ nephrolithiasis  -11/19/2018 CT abdomen and pelvis: 9mm calculus of right UPJ  -7/14 CT abdomen and pelvis: Right ureteropelvic junction calculus measuring 6 mm with mild upstream pelvocaliectasis without frank hydronephrosis. Adjacent urothelial thickening likely reflects inflammation. Persistent small left lower pole nonobstructing renal calculus. History of  AKI- resolved    History of Viral pericarditis status-post pericardiectomy in 1993    Hyperlipidemia    Former tobacco use (quit 1978)    Recommendations:     1. Continue ertapenem for now  2. Follow-up cultures   3. The etiology of the initial abscess/recurrence remains unclear.  Await ERCP later today.  4. Monitor temp and WBC count    We'll follow  __________________________________________________________________________________    Subjective/Interval History     Patient remains afebrile  Had a second drain placed by IR today   It seems liver biopsy was not done  Still feeling fatigued  Has been having hiccups  No sore throat  No SOB   Has been having a cough productive of green phlegm  No nausea, vomiting, abdominal pain or diarrhea  No dysuria???    WBC 23.6 <--22.1 <--17.8  Hb 11.3  Platelets 173  Creatinine 1.02  AST 99 <--129 <--169 <--236  ALT 162 <--223 <--294 <--343  ALP 411 <--428 <--338 <--422   T bili 2.7 <--2.6 <--2.1 <--1.7    7/13 UA: 2-10 WBC, negative leukocytes, negative nitrite, 1+ blood, 2-10 RBC  7/13 BC x 2 sets NTD  7/13 fungal blood cultures x2 sets in process    Discussed with Dr. Adela Lank.  ???  Antimicrobial Start date End date   Zosyn   6/17 x 1 dose; 6/19-6/25/19   11/19/2018 x1 dose ???   Ertapenem  6/25-7/25/19; 11/20/2018  active   Augmentin  7/25-8/10; 12/22/17 ???01/05/18   ??? ??? ???   ??? ??? ???   ??? ??? ???   ??? ??? ???   Estimated Creatinine Clearance: 58.8 mL/min (based on SCr of 1.02 mg/dL).    Medications   Scheduled Meds:ertapenem (INVanz) IVP 1 g, 1 g, Intravenous, Q24H*  [MAR Hold] lidocaine (LIDODERM) 5 % topical patch 1 patch, 1 patch, Topical, QDAY    And  [MAR Hold] Verification of Patch Placement and Integrity - Lidocaine 5%, , Transdermal, BID  phytonadione (VITAMIN K) 10 mg in dextrose 5% (D5W) 50 mL IVPB, 10 mg, Intravenous, QDAY  [MAR Hold] polyethylene glycol 3350 (MIRALAX) packet 17 g, 1 packet, Oral, QDAY  [MAR Hold] senna/docusate (SENOKOT-S) tablet 1 tablet, 1 tablet, Oral, BID sodium chloride 0.9 %   infusion, 1,000 mL, Intravenous, ONCE  [MAR Hold] sodium chloride PF 0.9% syringe 10 mL, 10 mL, Intravenous, Q8H*    Continuous Infusions:  ??? lactated ringers infusion 500 mL (11/21/18 2019)     PRN and Respiratory Meds:[MAR Hold] acetaminophen/lidocaine/antacid DS(#) Q4H PRN, [MAR Hold] fentaNYL citrate PF Q2H PRN, flumazeniL PRN, naloxone PRN, [MAR Hold] phenol PRN, [MAR Hold] prochlorperazine Q6H PRN      Allergies   No Known Allergies      Physical Examination                          Vital Signs: Last                  Vital Signs: 24 Hour Range   BP: 127/58 (07/16 1215)  Temp: 36.7 ???C (98.1 ???F) (07/16 1000)  Pulse: 73 (07/16 1215)  Respirations: 17 PER MINUTE (07/16 1200)  SpO2: 97 % (07/16 1215)  SpO2 Pulse: 73 (07/16 1215) BP: (122-161)/(53-140)   Temp:  [36.3 ???C (97.4 ???F)-36.9 ???C (98.5 ???F)]   Pulse:  [60-84]   Respirations:  [15 PER MINUTE-21 PER MINUTE]   SpO2:  [94 %-99 %]  General appearance: Alert, lethargic, in NAD  HENT: No thrush  Lungs: CTAB anteriorly and laterally, no wheezing, rhonchi  Heart: Regular rhythm, reg rate, no murmur, rub, gallop  Abdomen: Soft, non-tender, non-distended, J-Vac drain in the right upper quadrant draining thick bloody purulent fluid, J-Vac drain in the epigastric area draining thin sanguinous fluid, normoactive bowel sounds  Ext: No clubbing, cyanosis; mild bilateral lower extremity edema  Skin: No rash    Lines: PIV      Lab Review   Hematology  Recent Labs     11/21/18  0424 11/22/18  0441 11/23/18  0456   WBC 17.8* 22.1* 23.6*   HGB 12.8* 12.7* 11.3*   HCT 37.0* 37.3* 32.2*   PLTCT 183 178 173   INR 1.4* 1.4* 1.5*     Chemistry  Recent Labs     11/21/18  0424 11/22/18  0441 11/23/18  0456   NA 134* 136* 135*   K 4.1 3.9 3.7   CL 99 97* 99   CO2 24 26 27    BUN 42* 45* 48*   CR 1.20 1.16 1.02   GFR 58* >60 >60   GLU 108* 90 109*   CA 9.5 9.1 8.3*   ALBUMIN 3.0* 2.9* 2.5*   ALKPHOS 338* 428* 411*   AST 169* 129* 99*   ALT 294* 223* 162* TOTBILI 2.1* 2.6* 2.7*       Microbiology, Radiology and other Diagnostics Review   Microbiology data reviewed.    Pertinent radiology images viewed.    7/15 CXR:  1. Heart size is mildly enlarged with mild pulmonary vascular congestion.  2. Small right pleural effusion with bibasilar opacities compatible with   atelectasis and/or pneumonia.    7/14 CT abdomen/pelvis:  1. Development of 2 new adjacent hepatic abscess/masses in the cephalad   liver, as described, the larger measuring up to 10 cm. Given prior   clinical history, these likely reflect recurrent abscesses however   underlying hepatic mass such as cholangiocarcinoma could appear similar.  2. Right ureteropelvic junction calculus measuring 6 mm with mild upstream   pelvocaliectasis without frank hydronephrosis. Adjacent urothelial   thickening likely reflects inflammation. Urinalysis is recommended.  3. Persistent small left lower pole nonobstructing renal calculus.  4. Marked sigmoid colon diverticulosis.  5. 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      Theotis Burrow, MD  Division of Infectious Diseases   Pager [phone]

## 2018-11-23 NOTE — Progress Notes
General Progress Note    Name:  Tyler Castillo   ZOXWR'U Date:  11/23/2018  Admission Date: 11/20/2018  LOS: 3 days                     Assessment/Plan:    Principal Problem:    Liver mass  Active Problems:    Leucocytosis    83 y.o.?????????male???past medical history hepatic mass, hyperlipidemia ???who presents with ???leukocytosis, lethargy, increasing hepatic???mass size.  ???  Liver???mass, recurrent  Leukocytosis  Possible Community Acquired Pneumonia  -history of liver abscess in 2019, status post drainage and 6 weeks ertapenem, 2 weeks Augmentin  -No clear etiology found. Gram stain of perihepatic fluid GPC resembling strep  -Asymptomatic???in interim.?????????1wk lethargy, vague symptoms.??????OSH CT interval increase in size. ???Leukocytosis.  -AST/ALT/ALP decreasing since admit: AST 129, ALT 223 and ALP 428 on 7/15   -INR 1.4 7/15  -s/p Zosyn and 1L fluids.???  -Transferred???from OSH in [place]???for???higher level of care  -On arrival???VSS (slight tachycardia), 2/4 SIRS, pt NAD. ???Benign abdominal exam.  -7/15 blood cx NG2D  - CT Abd/Pelvis 7/14: 2 new adjacent hepatic abcesses/masses in cephalad liver (larger measuring up to 10cm) may reflect recurrent abscess but cholangiocarcinoma could appear similar; right UPJ calculus 6mm with upstream pelvocaliectasis without frank hydronephrosis. Adjacent urothelial thickening likely reflects inflammation (UA negative). 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  -  IR???7/15???for left???liver abscess aspiration/drainage/biopsy, sent fluid/tissue for routine, anaerobic, and fungal cultures  -  IR???7/16???for right liver abscess aspiration/drainage/biopsy, sent fluid/tissue for routine, anaerobic, and fungal cultures  - productive cough of grey sputum for 2 days duration - CXR 7/15 showed Small right pleural effusion with bibasilar opacities compatible with   atelectasis and/or pneumonia.   Plan  >Ertapenem 1g q24h???first day 7/13 (for liver abscesses and would also cover for a CAP)  >Hepatology consult: vitamin K 10 mg IV x3 days  > ID consult: continue ertapenem; f/u blood cultures; etiology remains unclear, may have to consider MRCP to further evaluate bile ducts and make sure this is not the etiology of recurrent abscesses  > f/u path  > f/u EUS/ERCP (7/16 1900)  ???  Right UPJ Nephrolithiasis  -9mm stone noted in R UPJ  -alternating R and L back pain, described as sharp and above the hip  -pt unaware of h/o stones. ???  - 7/13 UA negative for infection  Plan  > Per urology:???-No acute surgical intervention required at this time;???Recommend obtaining imaging while he is being treated (this may be done during monitoring of his liver abscess);???please include the kidneys during this imaging to monitor for increase in hydronephrosis;???CT scan or Renal US   -Urology will follow-up on images, please contact with any questions or concerns  ???  ???  Chest Pain, resolved  RBBB  -c/o lower chest/upper abdomen bilateral sharp pain in band-like manner   -trops negative EKG RBBB at OSH. No ekg at [facility]  - previous EKG showing RBBB, so not new  - troponin 0.03 7/13  -???EKG???7/13: RBBB, inferior infarct, age indeterminate  - pain was actually more epigastric and resolved with GI cocktail  Plan  - continue to monitor  ???  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 ???Recommending inpatient setting for discharge  Code status:???DNAR-FI???(Discussed on 11/20/2018)??????  ???  ???  Patient seen & plan discussed with Dr.???Opole  ???  Howard Pouch, MD  Anesthesiology, PGY-1  Available on [facility]  Pager [phone]  ________________________________________________________________________    Subjective Tyler Castillo is a 84 y.o. male.  Patient having some back pain this morning from site of drain placement in IR. Patient reports relief with prn fentanyl. Patient also endorses a cough that has been going on for a couple of days that is productive of greyish sputum. Denies chest pain, SOB. Denies constipation/diarrhea. Patient reports that he feels like his weakness is improving.    Medications  Scheduled Meds:ertapenem (INVanz) IVP 1 g, 1 g, Intravenous, Q24H*  lidocaine (LIDODERM) 5 % topical patch 1 patch, 1 patch, Topical, QDAY    And  Verification of Patch Placement and Integrity - Lidocaine 5%, , Transdermal, BID  polyethylene glycol 3350 (MIRALAX) packet 17 g, 1 packet, Oral, QDAY  senna/docusate (SENOKOT-S) tablet 1 tablet, 1 tablet, Oral, BID  sodium chloride 0.9 %   infusion, 1,000 mL, Intravenous, ONCE  sodium chloride PF 0.9% syringe 10 mL, 10 mL, Intravenous, Q8H*    Continuous Infusions:  ??? lactated ringers infusion 500 mL (11/21/18 2019)     PRN and Respiratory Meds:acetaminophen/lidocaine/antacid DS(#) Q4H PRN, fentaNYL citrate PF Q2H PRN, flumazeniL PRN, naloxone PRN, phenol PRN, prochlorperazine Q6H PRN      Review of Systems:  Review of Systems   Constitutional: Negative for chills and fever.   Respiratory: Negative for shortness of breath.    Cardiovascular: Negative for chest pain.   Gastrointestinal: Negative for abdominal pain, constipation and diarrhea.   Genitourinary: Negative for dysuria.   Musculoskeletal: Positive for back pain (at site of drain insertion). Negative for myalgias.   Skin: Negative for rash.   Neurological: Positive for weakness (improving). Negative for dizziness and headaches.   Psychiatric/Behavioral: Negative for depression.         Objective:                          Vital Signs: Last Filed                 Vital Signs: 24 Hour Range   BP: 131/52 (07/16 1518)  Temp: 36.7 ???C (98.1 ???F) (07/16 1518)  Pulse: 77 (07/16 1518)  Respirations: 16 PER MINUTE (07/16 1518) SpO2: 94 % (07/16 1518)  SpO2 Pulse: 73 (07/16 1215) BP: (122-161)/(52-140)   Temp:  [36.3 ???C (97.4 ???F)-36.9 ???C (98.5 ???F)]   Pulse:  [60-84]   Respirations:  [15 PER MINUTE-21 PER MINUTE]   SpO2:  [94 %-99 %]    Intensity Pain Scale (Self Report): 5 (11/23/18 0919) Vitals:    11/20/18 0132   Weight: 83.9 kg (185 lb)       Intake/Output Summary:  (Last 24 hours)    Intake/Output Summary (Last 24 hours) at 11/23/2018 1541  Last data filed at 11/23/2018 1518  Gross per 24 hour   Intake 250 ml   Output 1343 ml   Net -1093 ml      Stool Occurrence: 0    Physical Exam  Physical Exam   Constitutional: He is oriented to person, place, and time and well-developed, well-nourished, and in no distress.   Drain x2 on lateral left back and anterolateral abdomen. Draining serosanguinous fluid   HENT:   Head: Normocephalic and atraumatic.   Eyes: No scleral icterus.   Neck: Normal range of motion.   Cardiovascular: Normal rate and regular rhythm.  Pulmonary/Chest: Effort normal and breath sounds normal.   Faint crackles heard at left lung base.   Abdominal: Soft. Bowel sounds are normal.   Musculoskeletal:         General: No edema.   Neurological: He is alert and oriented to person, place, and time.   Skin: Skin is warm and dry.        Lab Review  24-hour labs:    Results for orders placed or performed during the hospital encounter of 11/20/18 (from the past 24 hour(s))   CBC AND DIFF    Collection Time: 11/23/18  4:56 AM   Result Value Ref Range    White Blood Cells 23.6 (H) 4.5 - 11.0 K/UL    RBC 3.35 (L) 4.4 - 5.5 M/UL    Hemoglobin 11.3 (L) 13.5 - 16.5 GM/DL    Hematocrit 16.1 (L) 40 - 50 %    MCV 96.0 80 - 100 FL    MCH 33.7 26 - 34 PG    MCHC 35.1 32.0 - 36.0 G/DL    RDW 09.6 11 - 15 %    Platelet Count 173 150 - 400 K/UL    MPV 9.0 7 - 11 FL    Neutrophils 93 (H) 41 - 77 %    Lymphocytes 4 (L) 24 - 44 %    Monocytes 3 (L) 4 - 12 %    Eosinophils 0 0 - 5 %    Basophils 0 0 - 2 % Absolute Neutrophil Count 21.95 (H) 1.8 - 7.0 K/UL    Absolute Lymph Count 0.91 (L) 1.0 - 4.8 K/UL    Absolute Monocyte Count 0.76 0 - 0.80 K/UL    Absolute Eosinophil Count 0.01 0 - 0.45 K/UL    Absolute Basophil Count 0.00 0 - 0.20 K/UL   COMPREHENSIVE METABOLIC PANEL    Collection Time: 11/23/18  4:56 AM   Result Value Ref Range    Sodium 135 (L) 137 - 147 MMOL/L    Potassium 3.7 3.5 - 5.1 MMOL/L    Chloride 99 98 - 110 MMOL/L    Glucose 109 (H) 70 - 100 MG/DL    Blood Urea Nitrogen 48 (H) 7 - 25 MG/DL    Creatinine 0.45 0.4 - 1.24 MG/DL    Calcium 8.3 (L) 8.5 - 10.6 MG/DL    Total Protein 5.4 (L) 6.0 - 8.0 G/DL    Total Bilirubin 2.7 (H) 0.3 - 1.2 MG/DL    Albumin 2.5 (L) 3.5 - 5.0 G/DL    Alk Phosphatase 409 (H) 25 - 110 U/L    AST (SGOT) 99 (H) 7 - 40 U/L    CO2 27 21 - 30 MMOL/L    ALT (SGPT) 162 (H) 7 - 56 U/L    Anion Gap 9 3 - 12    eGFR Non African American >60 >60 mL/min    eGFR African American >60 >60 mL/min   PROTIME INR (PT)    Collection Time: 11/23/18  4:56 AM   Result Value Ref Range    INR 1.5 (H) 0.8 - 1.2   CULTURE-WOUND/TISSUE/FLUID(AEROBIC ONLY)W/SENSITIVITY    Collection Time: 11/23/18 10:44 AM   Result Value Ref Range    Battery Name ROUTINE CULTURE     Specimen Description MISC FLUID  LIVER ABSCESS       Special Requests NONE     Direct Gram Stain MANY  NEUTROPHILS  NO ORGANISMS SEEN       Culture  Report Status     GRAM STAIN    Collection Time: 11/23/18 10:44 AM   Result Value Ref Range    Battery Name GRAM STAIN      Specimen Description MISC FLUID  LIVER ABSCESS       Special Requests NONE     Gram Stain MANY  NEUTROPHILS  NO ORGANISMS SEEN       Report Status FINAL  11/23/2018      POC GLUCOSE    Collection Time: 11/23/18 11:14 AM   Result Value Ref Range    Glucose, POC 86 70 - 100 MG/DL       Point of Care Testing  (Last 24 hours)  Glucose: (!) 109 (11/23/18 0456)  POC Glucose (Download): 86 (11/23/18 1114)    Radiology and other Diagnostics Review:    CXR 1 view 7/15 1. Heart size is mildly enlarged with mild pulmonary vascular congestion.  2. Small right pleural effusion with bibasilar opacities compatible with   atelectasis and/or pneumonia.    Howard Pouch, MD   Pager

## 2018-11-23 NOTE — Progress Notes
PHYSICAL THERAPY  PROGRESS NOTE        Name: Tyler Castillo        MRN: [id number]          DOB: [birth year]          Age: 84 y.o.  Admission Date: 11/20/2018             LOS: 3 days        Mobility  Progressive Mobility Level: Walk in room  Distance Walked (feet): 10 ft  Level of Assistance: Assist X1  Assistive Device: Walker  Time Tolerated: 11-30 minutes  Activity Limited By: Weakness    Subjective  Significant hospital events: Admitted for recurrent liver mass (drain placed 7/15), R nephrolithiasis, and weakness/malaise.  Recent admission to OSH for dehydration (7/9-7/9).  PMH:  PMH, liver mass (10/2017).  Mental / Cognitive Status: Alert;Oriented;Cooperative  Pain: Patient demonstrates non-verbal signs of pain  Pain Interventions: Patient assisted into position of comfort  Comments: IR this morning for hepatic drain placement   Ambulation Assist: Independent Mobility in Community without Device  Patient Owned Equipment: None  Home Situation: Lives Alone  Type of Home: House  Entry Stairs: 1-2 Stairs;Rail on Both Sides  In-Home Stairs: Able to Live on One Level  Comments: Reports fall 2-3 wks. ago but no other incidents in the last year.  Son/DIL nearby and assist with meal prep and community mobility, as needed.       Bed Mobility/Transfer  Bed Mobility: Supine to Sit: Minimal Assist;Head of Bed Elevated;Verbal Cues;Use of Rail  Transfer Type: Sit to Stand  Transfer: Assistance Level: From;Bed;Moderate Assist  Transfer: Assistive Device: Nurse, adult  Transfers: Type Of Assistance: Elevated Bed;For Balance;For Strength Deficit;Verbal Cues;Requires Extra Time  End Of Activity Status: Up in Chair;Nursing Notified;Instructed Patient to Request Assist with Mobility;Instructed Patient to Use Call Light       Gait  Gait Distance: 10 feet  Gait: Assistance Level: Moderate Assist  Gait: Assistive Device: Nurse, adult  Gait: Descriptors: Pace: Slow;Forward trunk flexion;Decreased step length;Decreased foot clearance RLE;Decreased foot clearance LLE;Decreased heel strike RLE;Decreased heel strike LLE;Variable step length  Activity Limited By: Weakness       Education  Persons Educated: Patient  Patient Barriers To Learning: None Noted  Topics: Plan/Goals of PT Interventions;Use of Assistive Device/Orthosis;Mobility Progression;Safety Awareness;Up with Assist Only;Importance of Increasing Activity;Positioning;Recommend Continued Therapy    Assessment/Progress  Impaired Mobility Due To: Decreased Strength;Impaired Balance;Deconditioning  Assessment/Progress: Should Improve w/ Continued PT  Discussed with RN; hoyer sling in chair due to increased assist needed to stand and patient possibly weaker today from procedure earlier today and bath prior to session.     AM-PAC 6 Clicks Basic Mobility Inpatient  Turning from your back to your side while in a flat bed without using bed rails: A lot  Moving from lying on your back to sitting on the side of a flatbed without using bedrails : A Lot  Moving to and from a bed to a chair (including a wheelchair): A Lot  Standing up from a chair using your arms (e.g. wheelchair, or bedside chair): A Lot  To walk in hospital room: A Lot  Climbing 3-5 steps with a railing: Total  Raw Score: 11  Standardized (T-scale) Score: 30.25  Basic Mobility CMS 0-100%: 66.76  CMS G Code Modifier for Basic Mobility: CL    Goals  Goal Formulation: With Patient  Time For Goal Achievement: 7 days  Patient Will Go Supine To/From Sit: w/  Stand By Assist, Ongoing  Patient Will Transfer Sit to Stand: w/ Stand By Assist, Ongoing  Patient Will Ambulate: Greater than 200 Feet, w/ Stand By Assist, Ongoing(least restrictive device)  Patient Will Go Up / Down Stairs: 1-2 Stairs, w/ Stand By Assist, Ongoing    Plan  Treatment Interventions: Mobility Training;Strengthening;Balance Activities  Plan Frequency: 5 Days per Week  PT Plan for Next Visit: Recommend x2 for sit to stand from chair surface. Progress gait x2 assist.     PT Discharge Recommendations  Recommendation: Inpatient setting    Therapist: Particia Jasper  Date: 11/23/2018

## 2018-11-23 NOTE — Other
Immediate Post Procedure Note    Date:  11/23/2018                                         Attending Physician:   Baldwin Jamaica MD  Performing Provider:  Odis Hollingshead, MD    Consent:  Consent obtained from patient.  Time out performed: Consent obtained, correct patient verified, correct procedure verified, correct site verified, patient marked as necessary.  Pre/Post Procedure Diagnosis:  Hepatic abscess  Indications:  As above    Anesthesia: Conscious Sedation  Procedure(s): Hepatic drain placement  Findings:  Purulent fluid aspirated s/p 12 F drain placement     Estimated Blood Loss:  None/Negligible  Specimen(s) Removed/Disposition:  Yes, sent to pathology  Complications: None  Patient Tolerated Procedure: Well  Post-Procedure Condition:  stable    Odis Hollingshead, MD  Pager [phone]

## 2018-11-23 NOTE — Progress Notes
Brief Urology Update Note     84 y/o male with recurrent hepatic abscess and RIGHT 8mm UPJ stone without hydronephrosis on CT scan on 7/14, improving creatinine, asymptomatic and a negative urine culture     - No acute surgical intervention required at this time  - Will set up an outpatient follow-up for stone treatment   - Please contact Urology if there are any significant changes or concerns     Patient discussed with Dr. Sonda Primes, who directed plan of care.    Lake Bells, MD  Urology

## 2018-11-24 ENCOUNTER — Encounter: Admit: 2018-11-24 | Discharge: 2018-11-24

## 2018-11-24 DIAGNOSIS — N19 Unspecified kidney failure: Secondary | ICD-10-CM

## 2018-11-24 DIAGNOSIS — E785 Hyperlipidemia, unspecified: Secondary | ICD-10-CM

## 2018-11-24 LAB — POC GLUCOSE: Lab: 107 mg/dL — ABNORMAL HIGH (ref 70–100)

## 2018-11-24 LAB — COMPREHENSIVE METABOLIC PANEL
Lab: 135 MMOL/L — ABNORMAL LOW (ref 137–147)
Lab: 3.7 MMOL/L — ABNORMAL LOW (ref >60)
Lab: 98 MMOL/L — ABNORMAL LOW (ref >60)

## 2018-11-24 LAB — PROTIME INR (PT): Lab: 1.5 g/dL — ABNORMAL HIGH (ref 0.8–1.2)

## 2018-11-24 MED ORDER — ALUM-MAG HYDROXIDE-SIMETH 200-200-20 MG/5 ML PO SUSP
30 mL | Freq: Once | ORAL | 0 refills | Status: CP
Start: 2018-11-24 — End: ?
  Administered 2018-11-25: 01:00:00 30 mL via ORAL

## 2018-11-24 MED ADMIN — WATER FOR INJECTION, STERILE IJ SOLN [79513]: 10 mL | INTRAVENOUS | @ 14:00:00 | Stop: 2018-11-24 | NDC 00409488723

## 2018-11-24 NOTE — Progress Notes
OCCUPATIONAL THERAPY  PROGRESS NOTE    Name: Tyler Castillo        MRN: [id number]          DOB: 05/04/1935          Age: 84 y.o.  Admission Date: 11/20/2018             LOS: 4 days    Mobility  Patient Turn/Position: Chair;Weight shifted (Chair)  Progressive Mobility Level: Walk in room  Distance Walked (feet): 10 ft(x2)  Level of Assistance: Assist X1  Assistive Device: Walker  Time Tolerated: 0-10 minutes  Activity Limited By: Weakness    Subjective  Pertinent Dx per Physician: PMH-hepatic mass, hyperlipidemia  who presents with  leukocytosis, lethargy, increasing hepatic mass size. Found to have abcess. Right neph tube placed 7/15 and left to be placed 7/16.  Precautions: Falls  Pain / Complaints: Patient agrees to participate in therapy    Objective  Psychosocial Status: Willing and Cooperative to Participate    Home Living  Type of Home: House    Prior Function  Level Of Independence: Independent with ADLs and functional transfers;Independent with homemaking w/ ambulation  Lives With: Alone  Receives Help From: None Needed    ADL's  Where Assessed: Standing at [employer]  Eating Assist: Independent  Grooming Assist: Stand By Assist  Grooming Deficits: Increased Time To Complete;Standing With Assistive Device    ADL Mobility  Transfer Type: Sit to/from stand  Transfer: Assistance Level: To/from;Bedside chair;Moderate assist;Verbal cues  Transfer: Assistive Device: Agricultural consultant: Type of Assistance: For balance;For safety considerations;For strength deficit;Requires extra time  End of Activity Status: Up in chair;Nursing notified;Instructed patient to use call light  Transfer Comments: Required verbal cues to push from chair rather than pull on walker handles. Verbal cue to reach back before sitting to ensure chair is appropriately positioned.  Standing Balance: Static standing balance;Dynamic standing balance;Minimal assist  Gait Distance: 20 feet  Gait: Assistance Level: Minimal assist Gait: Assistive Device: Roller walker  Gait Comments: Contact guard for ambulation to/from sink. Stood with use of counter and walker for balance.    Cognition  Overall Cognitive Status: WFL to Adequately Complete Self Care Tasks Safely    Assessment  Assessment: Decreased ADL Status;Decreased Endurance;Decreased Self-Care Trans;Decreased High-Level ADLs  Prognosis: Good;w/Cont OT s/p Acute Discharge  Goal Formulation: Patient  Comments: Patient making significant improvements in mobility and endurance. Anticipate ongoing progress towards prior level of function with additional therapeutic intervention.    AM-PAC 6 Clicks Daily Activity Inpatient  Putting on and taking off regular lower body clothes?: A Lot  Bathing (Including washing, rinsing, drying): A Lot  Toileting, which includes using toilet, bedpan, or urinal: A Lot  Putting on and taking off regular upper body clothing: A Little  Taking care of personal grooming such as brushing teeth: A Little  Eating meals?: None  Daily Activity Raw Score: 16  Standardized (t-scale) score: 35.96  CMS 0-100% Score: 53.32  CMS G Code Modifier: CK    Plan  Progress: Progressing Toward Goals  OT Frequency: 3-5x/week  OT Plan for Next Visit: Ambulate to bathroom for toileting, standing at sink and LB dressing.    ADL Goals  Patient Will Perform Grooming: Standing at Sink;w/ Minimum Assist  Patient Will Perform Toileting: w/ Bedside Commode;w/ Moderate Assist    Functional Transfer Goals  Pt Will Perform All Functional Transfers: Minimum Assist    OT Discharge Recommendations  Recommendation: Inpatient setting  Patient Currently Requires Physical  Assist With: All mobility;All personal care ADLs;All home functioning ADLs    Therapist: Azucena Kuba, OTR/L  Date: 11/24/2018

## 2018-11-24 NOTE — Progress Notes
EGD with gastric biopsy to r/o h-pylori    EUS diagnostic only    ERCP with sphincterotomy to treat bile leak

## 2018-11-24 NOTE — Anesthesia Pain Rounding
Anesthesia Follow-Up Evaluation: Post-Procedure Day One    Name: Tyler Castillo     MRN: [id number]     DOB: 1934/11/05     Age: 84 y.o.     Sex: male   __________________________________________________________________________     Procedure Date: 11/23/2018   Procedure: Procedure(s):  ENDOSCOPIC RETROGRADE CHOLANGIOPANCREATOGRAPHY WITH SPHINCTEROTOMY/ PAPILLOTOMY  ESOPHAGOGASTRODUODENOSCOPY WITH ENDOSCOPIC ULTRASOUND EXAMINATION - FLEXIBLE  ESOPHAGOGASTRODUODENOSCOPY WITH BIOPSY - FLEXIBLE    Physical Assessment  Height: 170.2 cm (67)  Weight: 83.9 kg (185 lb)    Vital Signs (Last Filed in 24 hours)  BP: 145/62 (07/17 0740)  Temp: 36.4 ???C (97.6 ???F) (07/17 0740)  Pulse: 77 (07/17 0740)  Respirations: 20 PER MINUTE (07/17 0740)  SpO2: 98 % (07/17 0740)  SpO2 Pulse: 70 (07/16 2000)    Patient History   Allergies  No Known Allergies     Medications  Scheduled Meds:ertapenem (INVanz) IVP 1 g, 1 g, Intravenous, Q24H*  lidocaine (LIDODERM) 5 % topical patch 1 patch, 1 patch, Topical, QDAY    And  Verification of Patch Placement and Integrity - Lidocaine 5%, , Transdermal, BID  polyethylene glycol 3350 (MIRALAX) packet 17 g, 1 packet, Oral, QDAY  senna/docusate (SENOKOT-S) tablet 1 tablet, 1 tablet, Oral, BID  sodium chloride 0.9 %   infusion, 1,000 mL, Intravenous, ONCE  sodium chloride PF 0.9% syringe 10 mL, 10 mL, Intravenous, Q8H*    Continuous Infusions:  PRN and Respiratory Meds:acetaminophen/lidocaine/antacid DS(#) Q4H PRN, fentaNYL citrate PF Q2H PRN, flumazeniL PRN, naloxone PRN, phenol PRN, prochlorperazine Q6H PRN      Diagnostic Tests  Hematology:   Lab Results   Component Value Date    HGB 11.9 11/24/2018    HCT 34.6 11/24/2018    PLTCT 213 11/24/2018    WBC 24.3 11/24/2018    NEUT 88 11/24/2018    ANC 21.33 11/24/2018    LYMPH 1 11/20/2018    ALC 1.95 11/24/2018    MONA 4 11/24/2018    AMC 0.87 11/24/2018    EOSA 0 11/24/2018    ABC 0.05 11/24/2018    MCV 94.9 11/24/2018    MCH 32.5 11/24/2018 MCHC 34.3 11/24/2018    MPV 9.3 11/24/2018    RDW 13.9 11/24/2018         General Chemistry:   Lab Results   Component Value Date    NA 135 11/24/2018    K 3.7 11/24/2018    CL 98 11/24/2018    CO2 26 11/24/2018    GAP 11 11/24/2018    BUN 43 11/24/2018    CR 0.97 11/24/2018    GLU 97 11/24/2018    GLU 111 12/12/2017    CA 8.0 11/24/2018    ALBUMIN 2.6 11/24/2018    LACTIC 1.7 10/24/2017    MG 1.9 11/20/2018    TOTBILI 3.9 11/24/2018    PO4 2.1 11/20/2018      Coagulation:   Lab Results   Component Value Date    PTT 24.7 11/20/2018    INR 1.5 11/24/2018         Follow-Up Assessment  Patient location during evaluation: floor      Anesthetic Complications:   Anesthetic complications: The patient did not experience any anesthestic complications.      Pain:  Score: 2    Management:adequate     Level of Consciousness: awake and alert   Hydration:acceptable     Airway Patency: patent   Respiratory Status: acceptable     Cardiovascular Status:acceptable  Regional/Neuroaxial:

## 2018-11-24 NOTE — Anesthesia Post-Procedure Evaluation
Post-Anesthesia Evaluation    Name: Tyler Castillo      MRN: [id number]     DOB: 1934-08-12     Age: 84 y.o.     Sex: male   __________________________________________________________________________     Procedure Information     Anesthesia Start Date/Time:  11/23/18 1814    Procedures:       ENDOSCOPIC RETROGRADE CHOLANGIOPANCREATOGRAPHY WITH SPHINCTEROTOMY/ PAPILLOTOMY (N/A )      ESOPHAGOGASTRODUODENOSCOPY WITH ENDOSCOPIC ULTRASOUND EXAMINATION - FLEXIBLE (N/A )      ESOPHAGOGASTRODUODENOSCOPY WITH BIOPSY - FLEXIBLE    Location:  ENDO 1 / ENDO/GI    Surgeon:  Gordy Savers, MD          Post-Anesthesia Vitals  BP: 132/68 (07/16 2000)  Temp: 36.6 C (97.9 F) (07/16 2000)  Pulse: 71 (07/16 2000)  Respirations: 17 PER MINUTE (07/16 2000)  SpO2: 94 % (07/16 2000)  SpO2 Pulse: 70 (07/16 2000)   Vitals Value Taken Time   BP 132/68 11/23/2018  8:00 PM   Temp 36.6 C (97.9 F) 11/23/2018  8:00 PM   Pulse 71 11/23/2018  8:00 PM   Respirations 17 PER MINUTE 11/23/2018  8:00 PM   SpO2 94 % 11/23/2018  8:00 PM         Post Anesthesia Evaluation Note    Evaluation location: Pre/Post  Patient participation: recovered; patient participated in evaluation  Level of consciousness: alert    Pain score: 0  Pain management: adequate    Hydration: normovolemia  Temperature: 36.0C - 38.4C  Airway patency: adequate    Perioperative Events       Post-op nausea and vomiting: no PONV    Postoperative Status  Cardiovascular status: hemodynamically stable  Respiratory status: spontaneous ventilation  Follow-up needed: none        Perioperative Events  Perioperative Event: No  Emergency Case Activation: No

## 2018-11-24 NOTE — Progress Notes
Infectious Disease Progress Note    Today's Date:  11/24/2018  Admission Date: 11/20/2018    Reason for this consultation: Patient with history of hepatic mass of unclear etiology with prior cultures growing GPC resembling strep. Treated with ertapenem. Patient presenting with new similar appearing lesion. Please assist with antibiotic recommendations.    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 No viable tumor cells are present. ???The finding may represent reaction adjacent to mass lesion, or may represent a liver abscess.  -10/26/17 normal liver biopsy: Mild sinusoidal dilation and changes consistent with biliary obstruction. No significant fibrosis.   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 Review of outside CT by IR - felt significantly improved and drain could be removed   -11/21/17 IR drain removal. Abscessogram- no fistula -12/01/17 finished ertapenem and started amox/clav, which he finished ~12/17/17  -12/22/17 another 14-day course of augmentin prescribed  -11/19/18 patient presented to [facility] ED with weakness, malaise, right-sided chest discomfort  -11/19/18 CT abdomen/pelvis: 7.2 x 8.9 cm heterogenous mass within hepatic dome posterior segment of R hepatic lobe; increased in size from prior study (July 19); interval development of 2.9x 3.0cm heterogenous mass within L hepatic lobe; interval development of mild (R) sided hydronephrosis secondary to 9mm calculus of right UPJ  -7/12 CXR: Cardiomegaly, chronic small right pleural effusion, right basilar atelectasis  -Leukocytosis and elevated liver enzymes  -7/13 BC x 2 sets NTD  -7/13 fungal blood cultures x2 sets NTD  -7/14 CT abdomen and pelvis: There is persistent cholelithiasis. The hepatic abscess previously demonstrated in the caudal right lobe adjacent to the gallbladder is not distinctly visualized and has likely resolved. Development of 2 new adjacent hepatic low-density lesions in the cephalad liver. The larger is centered in hepatic segments 7/8 and measures approximately 10 x 7 x 9 cm (axial image 17 and coronal image 87). The adjacent smaller abscess is within hepatic segment 4A and measures approximately 5 x 3.5 x 4.5 cm (axial image 15 and coronal image 50).  -7/15 IR placed a drain in the smaller left-sided hepatic abscess; 20 mL of bloody purulent fluid was aspirated.  No biopsy done and no drain placed in the large right-sided hepatic abscess  -7/15 smaller left-sided hepatic abscess fluid GS: Many PMN, no org; routine culture NTD.  Anaerobic culture + MG Fusobacterium nucleatum  -7/16 large right-sided hepatic abscess fluid GS: Many PMN, no org; cultures in progress  -7/16 ERCP: Injection of contrast revealed a 5 mm CBD with no distal filling defect suggestive of stone.  A guide wire was passed into the intrahepatics and a 7 mm traction biliary sphincterotomy was performed. There was no bleeding. Then a stone extraction balloon was swept multiple times through the duct no stone was extracted. Following this???a balloon occlusion cholangiogram was performed and did showe small intrahepatic ducts with a connection between the right anterior duct with one of the abscess.    Right hydronephrosis  Right UPJ nephrolithiasis  -11/19/2018 CT abdomen and pelvis: 9mm calculus of right UPJ  -7/14 CT abdomen and pelvis: Right ureteropelvic junction calculus measuring 6 mm with mild upstream pelvocaliectasis without frank hydronephrosis. Adjacent urothelial thickening likely reflects inflammation. Persistent small left lower pole nonobstructing renal calculus.    History of AKI- resolved    History of Viral pericarditis status-post pericardiectomy in 1993    Hyperlipidemia    Former tobacco use (quit 1978)    Recommendations:     1. Continue ertapenem for now  2. Follow-up cultures and liver biopsy results  3. Monitor drains' output  4. Monitor temp and WBC count    We'll follow  __________________________________________________________________________________    Subjective/Interval History     Patient remains afebrile  Had hiccups overnight but better this morning  Had upper abdominal discomfort in the morning  No SOB   Cough improved, still productive of green phlegm  No nausea, vomiting or diarrhea  No dysuria???    WBC 24.3 <--23.6 <--22.1 <--17.8  Hb 11.9  Platelets 213  Creatinine 0.97  AST 64???improved  ALT 112???improved  ALP 386???improved  T bili 3.9???increased    Drains output in the past 24 hours:   #1: 225 mL (flushed with 20 mL)  #2: 295 mL (flushed with 10 mL)    ???  Antimicrobial Start date End date   Zosyn   6/17 x 1 dose; 6/19-6/25/19   11/19/2018 x1 dose ???   Ertapenem  6/25-7/25/19; 11/20/2018  active   Augmentin  7/25-8/10; 12/22/17 ???01/05/18   ??? ??? ???   ??? ??? ???   ??? ??? ???   ??? ??? ??? Estimated Creatinine Clearance: 61.8 mL/min (based on SCr of 0.97 mg/dL).    Medications   Scheduled Meds:ertapenem (INVanz) IVP 1 g, 1 g, Intravenous, Q24H*  lidocaine (LIDODERM) 5 % topical patch 1 patch, 1 patch, Topical, QDAY    And  Verification of Patch Placement and Integrity - Lidocaine 5%, , Transdermal, BID  polyethylene glycol 3350 (MIRALAX) packet 17 g, 1 packet, Oral, QDAY  senna/docusate (SENOKOT-S) tablet 1 tablet, 1 tablet, Oral, BID  sodium chloride 0.9 %   infusion, 1,000 mL, Intravenous, ONCE  sodium chloride PF 0.9% syringe 10 mL, 10 mL, Intravenous, Q8H*  WATER FOR INJECTION, STERILE IJ SOLN (Cabinet Override), , , NOW    Continuous Infusions:    PRN and Respiratory Meds:acetaminophen/lidocaine/antacid DS(#) Q4H PRN, fentaNYL citrate PF Q2H PRN, flumazeniL PRN, naloxone PRN, phenol PRN, prochlorperazine Q6H PRN      Allergies   No Known Allergies      Physical Examination                          Vital Signs: Last                  Vital Signs: 24 Hour Range   BP: 145/62 (07/17 0740)  Temp: 36.4 ???C (97.6 ???F) (07/17 0740)  Pulse: 77 (07/17 0740)  Respirations: 20 PER MINUTE (07/17 0740)  SpO2: 98 % (07/17 0740)  SpO2  Pulse: 70 (07/16 2000) BP: (122-161)/(52-140)   Temp:  [36.3 ???C (97.4 ???F)-36.9 ???C (98.5 ???F)]   Pulse:  [60-84]   Respirations:  [15 PER MINUTE-28 PER MINUTE]   SpO2:  [94 %-100 %]      General appearance: Alert, lethargic, in NAD  HENT: No thrush  Lungs: Decreased breath sounds over lower lungs posteriorly with occasional fine crackles, otherwise clear  Heart: Regular rhythm, reg rate, no murmur, rub, gallop  Abdomen: Soft, non-tender, non-distended, J-Vac drain in the right upper lateral abdomen draining dark bloody fluid, J-Vac drain in the right upper quadrant draining thin sanguinous fluid, normoactive bowel sounds  Ext: No clubbing, cyanosis; mild bilateral lower extremity edema  Skin: No rash    Lines: PIV      Lab Review   Hematology  Recent Labs     11/22/18  0441 11/23/18 0456 11/24/18  0439   WBC 22.1* 23.6* 24.3*   HGB 12.7* 11.3* 11.9*   HCT 37.3* 32.2* 34.6*   PLTCT 178 173 213   INR 1.4* 1.5* 1.5*     Chemistry  Recent Labs     11/22/18  0441 11/23/18  0456 11/24/18  0439   NA 136* 135* 135*   K 3.9 3.7 3.7   CL 97* 99 98   CO2 26 27 26    BUN 45* 48* 43*   CR 1.16 1.02 0.97   GFR >60 >60 >60   GLU 90 109* 97   CA 9.1 8.3* 8.0*   ALBUMIN 2.9* 2.5* 2.6*   ALKPHOS 428* 411* 386*   AST 129* 99* 64*   ALT 223* 162* 112*   TOTBILI 2.6* 2.7* 3.9*       Microbiology, Radiology and other Diagnostics Review   Microbiology data reviewed.    Pertinent radiology images viewed.    7/15 CXR:  1. Heart size is mildly enlarged with mild pulmonary vascular congestion.  2. Small right pleural effusion with bibasilar opacities compatible with   atelectasis and/or pneumonia.    7/14 CT abdomen/pelvis:  1. Development of 2 new adjacent hepatic abscess/masses in the cephalad   liver, as described, the larger measuring up to 10 cm. Given prior   clinical history, these likely reflect recurrent abscesses however   underlying hepatic mass such as cholangiocarcinoma could appear similar.  2. Right ureteropelvic junction calculus measuring 6 mm with mild upstream   pelvocaliectasis without frank hydronephrosis. Adjacent urothelial   thickening likely reflects inflammation. Urinalysis is recommended.  3. Persistent small left lower pole nonobstructing renal calculus.  4. Marked sigmoid colon diverticulosis.  5. 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      Theotis Burrow, MD  Division of Infectious Diseases   Pager [phone]

## 2018-11-24 NOTE — Progress Notes
SPEECH-LANGUAGE PATHOLOGY  CLINICAL SWALLOW ASSESSMENT     EVALUATION SUMMARY  Summary: Clinical swallow reveals functional oropharyngeal swallow.  The patient presented w/ overt coughing x1 throughout all trials after large/consecutive drinks although anticipate presence of concurrent hiccups also contributing to this. Pt reports occasional issues prior to this hospitalization as noted w/ globus sensation at the level of the sternal notch and regurgitation of material. Suspect recent diagnosis of reflux esophagitis along w/ hiatal hernia to be contributing factors to pt complaints.  Discussed reflux control strategies (ie: upright for 60 minutes after a meal, avoiding acidic/spicy foods, small meals), although anticipate repeated instruction from providers would be beneficial for carry-over. Discussed w/ RN and primary team. Please see below for additional information.    Swallow Recommendations*  PO: Regular solids, Thin Liquids  Swallow Strategies: Small Bites/Sips, Slow Rate of Intake, Alternate Solids and Liquids  Good oral care  Medications as tolerated by mouth  No further ST work-up for oropharyngeal dysphagia at this time. Should primary team have further concerns for aspiration a videoswallow can be ordered.     Oral Stage   Pt assessed w/ thin liquids, pureed/mechanical soft/regular solids  Withdrawal: WFL, pt noted to take independently small bites of solids and alternate solids/liquids  Bolus formation: WFL  Mastication: Functional, slightly slowed  Transfer: WFL.  Anterior bolus spillage:WFL, none  Residues: Functional, mild lingual residues after the swallow for regular solids, clears w/ liquid wash    Pharyngeal stage  Laryngeal elevation:Timely Palpable laryngeal elevation noted upon swallow   Signs/symptoms of aspiration: Pt presents w/ coughing x1 after the swallow for large/consecutive drinks of thin liquids, however pt also presents w/ concurrent hiccups which likely alter coordination of deglutition/respiration cycle.  Pt presents w/ no further s/s of aspiration w/ thin liquids or solids. 1-2 swallows/bolus.  Pt denies globus sensation.     Plan: Patient Functioning at Baseline. No Further Speech Therapy Indicated at this Time.    Prognosis: Fair  NOMS Dysphagia Rating: 5-Mild Dysphagia -Swallow safe w/ min diet restriction &/or occasionally requires min cues to use compensatory strategies. May occasionally self-cue. All nutrition/hydration needs met by mouth at mealtime.  Results Reported to Physician: Yes    Objective*  Relevant Med Background: Tyler Castillo is an 84 y.o.?????????male???past medical history hepatic mass, hyperlipidemia ???who presents with ???leukocytosis, lethargy, increasing hepatic???mass size.  Chest Xray 11/22/2018  IMPRESSION  1. Heart size is mildly enlarged with mild pulmonary vascular congestion.  2. Small right pleural effusion with bibasilar opacities compatible with   atelectasis and/or pneumonia.    Lives With: Alone  Receives Help From: None Needed  Psychosocial Status: Willing and Cooperative to Participate    Subjective*  Pain: Patient has no complaint of pain, Patient demonstrates no signs of pain  Trach Presence: No  Feeding Tube Present During Eval: None    Nutrition*  Nutrition Prior To Hospitalization: Oral, Regular, Thin Liquids  Current Form Of Nutrition: Oral, Regular, Thin Liquids    Oral Mech Exam  Oral Mech WFL*: No  Oral Mech Exam Summary*: Full upper plate and lower partial plate w/ remaining anterior natrual dentition. Pt endorses occasional odynophagia s/p procedures, although not at this time. Face appears grossly symmetrical. Few red patches noted on velum. Voice minimally hoarse/strained, suspect this is secondary to procedures.     Swallow Strategies  Small Bites and Sips: Effective  Slow Rate of Intake: Effective  Alternate Solids / Liquids: Effective    Education*  Persons Educated: Patient  Barriers  To Learning: Hearing, Family Not Present Interventions: Staff Educated, Repetition of Instructions  Teaching Methods: Verbal, Demonstration  Topics: Dysphagia  Patient Response: Verbalized Understanding, Return Demonstration  Goal Formulation: With Patient            Therapist:Audi Conover, MA,CCC-SLP  Date:11/24/2018

## 2018-11-24 NOTE — Progress Notes
Gastroenterology Consult Note  Patient Name:Khiyan CORTNEY MCCORQUODALE         ZOX:[id number]  Admission Date: 11/20/2018  1:16 AM      Principal Problem:    Liver mass  Active Problems:    Leucocytosis      History of Present Illness/Subjective:  BRYTEN SHURTS is a 84 y.o. male with history of liver abscess versus infected mass, pericarditis s/p pericardectomy in 1993 presented to OSH 7/12 for 1 week of general fatigue, dizziness.  Hepatology was consulted when imaging showed increase in liver lesion previously consistent with abscess.    The patient had some difficulty sleeping because he felt confused after the procedure.  This happened after one of his drain placements as well.  He has some pain in his back and bad reflux.  Denies any fevers, chills nausea.  He plans to eat breakfast as he has not had much between procedures    Assessment/ Plan:    Hepatic abscesses  MRI 10/27/2017 segment five 9 cm mass.  Underwent percutaneous drainage, drain left in place.  Biopsy 10/26/2017 showed necrosis, mixed inflammatory infiltrate; blood cultures grew fusobacterium and strep (from fluid aspirate adjacent to mass); amebiasis negative.  Drain removed 11/21/2017, negative abscessogram.  Finished ertapenem 12/01/2017, Augmentin finish 12/17/2017.  CT 12/2017 showed decrease in size of abscess measuring 5.8 x 5.1 cm (previously 9.0 x 7.0 cm).   1. Liver Bx 10/26/17: The native liver biopsy shows mild sinusoidal dilation. ???The portal triads show mild neutrophilic inflammation and a bile ductular reaction, consistent with biliary outflow obstruction. ???These findings are often seen adjacent to a mass lesion. ???There is no evidence of chronic or acute hepatitis. ???There is no significant fibrosis.   2. 2nd admission CT: abscess from 10/2017 appeared resolved. 7.2 x 8.9 cm mass in the hepatic dome posterior segment of right hepatic lobe, interval development of 2.9 x 3.0 heterogeneous mass in the left hepatic lobe 3. S/p IR drain placement 7/15 (bloody, purulent fluid)  4. S/p second IR drain placement 7/16 in larger lesion; no solid lesion to biopsy  5. EUS/ERCP 7/16 showed small intrahepatics, peripheral bile leak into abscess cavity s/p sphincterotomy (no stent placed)  6. Colonoscopy report 2017 reviewed: Dense sigmoid diverticulosis with tortuosity (recommended pediatric scope on repeat), internal hemorrhoids otherwise normal  LA D esophagitis  Diverticulosis  Delirium Periprocedural, after sedation    Recommendations:  Improvement in transaminases. Bile leak s/p sphincterotomy.  So far culture negative pyogenic abscess. ?Fastidious organism.  Source could be colonic in origin, especially given his dense sigmoid diverticulosis.   -Continue ertapenem, appreciate infectious disease recommendations; at this point barrier to discharge is antibiotic plan and lab monitoring  -PO PPI twice daily until next endoscopy in 3 months for esophagitis  -We will follow along culture and sensitivity, pathology from IR biopsy    Future Appointments   Date Time Provider Department Center   12/13/2018  9:00 AM Clarita Crane, MD, [facility]       Patient seen/discussed with Dr. Livia Snellen Marthenia Rolling, MD  GI/Hepatology Fellow [phone]    -----------------------------  PMH:  Medical History:   Diagnosis Date   ??? Hyperlipidemia    ??? Renal failure        Current medications:  No current facility-administered medications on file prior to encounter.      Current Outpatient Medications on File Prior to Encounter   Medication Sig Dispense Refill   ??? acetaminophen (TYLENOL) 500 mg tablet Take 500  mg by mouth every 6 hours as needed for Pain. Max of 4,000 mg of acetaminophen in 24 hours.     ??? aspirin EC 81 mg tablet Take 81 mg by mouth daily. Take with food.     ??? Calcium Carbonate-Mag Hydroxid (ROLAIDS) 550-110 mg chew Chew 1-2 tablets by mouth as Needed.     ??? diphenhydrAMINE HCL (ZZZQUIL) 50 mg/30 mL liqd Take 10 mL by mouth at bedtime as needed.     ??? vitamins, multiple cap Take 1 capsule by mouth daily.         PSH:  Surgical History:   Procedure Laterality Date   ??? ESOPHAGOGASTRODUODENOSCOPY WITH CONTROL OF BLEEDING - FLEXIBLE N/A 10/31/2017    Performed by Buckles, Vinnie Level, MD at [facility] ENDO   ??? HX APPENDECTOMY     ??? LUNG SURGERY         SH:  Social History     Socioeconomic History   ??? Marital status: Married     Spouse name: Not on file   ??? Number of children: 1   ??? Years of education: Not on file   ??? Highest education level: Not on file   Occupational History   ??? Not on file   Tobacco Use   ??? Smoking status: Former Smoker     Packs/day: 0.50     Years: 20.00     Pack years: 10.00     Types: Cigarettes     Last attempt to quit: 10/24/1977     Years since quitting: 41.1   ??? Smokeless tobacco: Never Used   Substance and Sexual Activity   ??? Alcohol use: Yes   ??? Drug use: Never   ??? Sexual activity: Not on file   Other Topics Concern   ??? Not on file   Social History Narrative   ??? Not on file       FH:  Family History   Problem Relation Age of Onset   ??? Stroke Mother        Review of Systems:  Constitutional: No fevers, chills, weight loss  Eyes: No vision deficit, no icterus  Ears, nose, mouth: No oral bleeding, ulcer  Cardiovascular: No chest pain, palpitations  Respiratory: No dyspnea, cough   Gastrointestinal: No abdominal pain, blood in stool   Musculoskeltal: No joint inflammation, new deformity  Integumentary: No rashes, exudate  Neurologic: No cognitive change, focal weakness  Hematologic: No for easy bleeding or bruising  Please see HPI for additional pertinent documentation    Physical Exam:  Vitals:    11/23/18 1955 11/23/18 2000 11/23/18 2105 11/24/18 0342   BP:  132/68 (!) 142/53 139/67   BP Source:   Arm, Left Upper Arm, Left Upper   Pulse: 74 71 66 84   Temp:  36.6 ???C (97.9 ???F) 36.9 ???C (98.5 ???F) 36.9 ???C (98.5 ???F)   SpO2: 96% 94% 94% 97%   Weight:       Height:         Constitutional- Vitals above, no acute distress. Head - Normocephalic, atraumatic.   Eyes -  EOMI grossly. No icterus or injection.   Ears, nose, mouth, throat- No oral ulcer or bleeding.   Neck - No swelling or tracheal deviation.   Respiratory - Symmetric chest rise, no increased work of breathing.  Cardiovascular - Peripheral pulses intact, no pedal edema.  Gastrointestinal- Non-TTP. No hepatospenomegaly.   Skin - No exposed rash, lesion.  Neurologic - No CN deficit, normal fluid  speech  Psychiatric - Judgement intact, thought content appropriate.    Labs/Imaging:  Pertient labs/imaging were reviewed on initiation of progress note.

## 2018-11-24 NOTE — Progress Notes
General Progress Note    Name:  Tyler Castillo   [id number] Date:  11/24/2018  Admission Date: 11/20/2018  LOS: 4 days                     Assessment/Plan:    Principal Problem:    Liver mass  Active Problems:    Leucocytosis    84 y.o.?????????male???past medical history hepatic mass, hyperlipidemia ???who presents with ???leukocytosis, lethargy, increasing hepatic???mass size.  ???  Liver???mass, recurrent  Leukocytosis  Possible Community Acquired Pneumonia  -history of liver abscess in 2019, status post drainage and 6 weeks ertapenem, 2 weeks Augmentin  -No clear etiology found. Gram stain of perihepatic fluid GPC resembling strep  -Asymptomatic???in interim.?????????1wk lethargy, vague symptoms.??????OSH CT interval increase in size. ???Leukocytosis.  -AST/ALT/ALP decreasing since admit: AST 129, ALT 223 and ALP 428 on 7/15   -INR 1.4 7/15  -s/p Zosyn and 1L fluids.???  -Transferred???from OSH in [place]???for???higher level of care  -On arrival???VSS (slight tachycardia), 2/4 SIRS, pt NAD. ???Benign abdominal exam.  -7/15 blood cx NG2D  - CT Abd/Pelvis 7/14: 2 new adjacent hepatic abcesses/masses in cephalad liver (larger measuring up to 10cm) may reflect recurrent abscess but cholangiocarcinoma could appear similar; right UPJ calculus 6mm with upstream pelvocaliectasis without frank hydronephrosis. Adjacent urothelial thickening likely reflects inflammation (UA negative). 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  -  IR???7/15???for left???liver abscess aspiration/drainage/biopsy, sent fluid/tissue for routine, anaerobic, and fungal cultures  -  IR???7/16???for right liver abscess aspiration/drainage/biopsy, sent fluid/tissue for routine, anaerobic, and fungal cultures  - productive cough of grey sputum for 2 days duration - CXR 7/15 showed Small right pleural effusion with bibasilar opacities compatible with   atelectasis and/or pneumonia.  - 7/16 ERCP with small intrahepatic peripheral bile leak into abscess now s/p sphincterotomy   Plan  >Ertapenem 1g q24h???first day 7/13 (for liver abscesses and would also cover for a CAP)  > Hepatology consult: vitamin K 10 mg IV x3 days, now complete  > ID consult: continue ertapenem; f/u blood cultures; etiology remains unclear  > f/u path from IR biopsy and drain placement on 7/15 and 7/16  ???  Right UPJ Nephrolithiasis  -9mm stone noted in R UPJ  -alternating R and L back pain, described as sharp and above the hip  -pt unaware of h/o stones. ???  - 7/13 UA negative for infection  Plan  > Follow up with urology as outpatient  ???  Chest Pain, resolved  RBBB  -c/o lower chest/upper abdomen bilateral sharp pain in band-like manner   -trops negative EKG RBBB at OSH. No ekg at [place]  - previous EKG showing RBBB, so not new  - troponin 0.03 7/13  -???EKG???7/13: RBBB, inferior infarct, age indeterminate  - pain was actually more epigastric and resolved with GI cocktail  Plan  - continue to monitor  ???  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 ???Recommending inpatient setting for discharge  Code status:???DNAR-FI???(Discussed on 11/20/2018)??????  ???  ???  Patient seen & plan discussed with Dr.???Opole  ???  Keane Police, MD  Internal Medicine, PGY-2  Available on Voalte/[employer]  Team Pager: [phone]  ________________________________________________________________________    Subjective  Tyler Castillo is a 84 y.o. male.  Patient seen and assessed. Endorses continued indigestion, nausea, and hiccuping.  Denies any abdominal pain or pain at drain sites. No subjective fever/chills in past 24 hours per patient. Denies shortness of breath or chest pains.     Medications Scheduled Meds:ertapenem (INVanz) IVP 1 g, 1 g, Intravenous, Q24H*  lidocaine (LIDODERM) 5 % topical patch 1 patch, 1 patch, Topical, QDAY    And  Verification of Patch Placement and Integrity - Lidocaine 5%, , Transdermal, BID  polyethylene glycol 3350 (MIRALAX) packet 17 g, 1 packet, Oral, QDAY  senna/docusate (SENOKOT-S) tablet 1 tablet, 1 tablet, Oral, BID  sodium chloride PF 0.9% syringe 10 mL, 10 mL, Intravenous, Q8H*    Continuous Infusions:    PRN and Respiratory Meds:acetaminophen/lidocaine/antacid DS(#) Q4H PRN, fentaNYL citrate PF Q2H PRN, flumazeniL PRN, naloxone PRN, phenol PRN, prochlorperazine Q6H PRN      Review of Systems:  Review of Systems   Constitutional: Negative for chills and fever.   Respiratory: Negative for shortness of breath.    Cardiovascular: Negative for chest pain.   Gastrointestinal: Negative for abdominal pain, constipation and diarrhea.   Genitourinary: Negative for dysuria.   Musculoskeletal:        Denies pain at drain insertion site   Skin: Negative for rash.   Neurological: Positive for weakness (improving).   Psychiatric/Behavioral: Negative for depression.         Objective:                          Vital Signs: Last Filed                 Vital Signs: 24 Hour Range   BP: 134/55 (07/17 1603)  Temp: 36.7 ???C (98 ???F) (07/17 1603)  Pulse: 62 (07/17 1603)  Respirations: 14 PER MINUTE (07/17 1603)  SpO2: 95 % (07/17 1603)  SpO2 Pulse: 70 (07/16 2000) BP: (125-153)/(53-69)   Temp:  [36.4 ???C (97.6 ???F)-36.9 ???C (98.5 ???F)]   Pulse:  [62-84]   Respirations:  [14 PER MINUTE-21 PER MINUTE]   SpO2:  [94 %-100 %]    Intensity Pain Scale (Self Report): Asleep (11/24/18 1120) Vitals:    11/20/18 0132   Weight: 83.9 kg (185 lb)       Intake/Output Summary:  (Last 24 hours)    Intake/Output Summary (Last 24 hours) at 11/24/2018 1718  Last data filed at 11/24/2018 1603  Gross per 24 hour   Intake 770 ml   Output 865 ml   Net -95 ml      Stool Occurrence: 0    Physical Exam  Physical Exam Constitutional: He is oriented to person, place, and time and well-developed, well-nourished, and in no distress.   Drain x2 on lateral left back and anterolateral abdomen. Draining serosanguinous fluid   HENT:   Head: Normocephalic and atraumatic.   Eyes: No scleral icterus.   Neck: Normal range of motion.   Cardiovascular: Normal rate and regular rhythm.   No murmur heard.  Pulmonary/Chest: Effort normal and breath sounds normal. No respiratory distress.   Abdominal: Soft. Bowel sounds are normal. He exhibits no distension. There is no abdominal tenderness.   Musculoskeletal:         General: No edema.   Neurological: He is alert and oriented to person, place, and time.   Skin: Skin is warm and dry.        Lab Review  24-hour labs:    Results for orders placed or performed during the hospital encounter of 11/20/18 (from the past 24  hour(s))   POC GLUCOSE    Collection Time: 11/23/18  9:22 PM   Result Value Ref Range    Glucose, POC 107 (H) 70 - 100 MG/DL   PROTIME INR (PT)    Collection Time: 11/24/18  4:39 AM   Result Value Ref Range    INR 1.5 (H) 0.8 - 1.2   CBC AND DIFF    Collection Time: 11/24/18  4:39 AM   Result Value Ref Range    White Blood Cells 24.3 (H) 4.5 - 11.0 K/UL    RBC 3.65 (L) 4.4 - 5.5 M/UL    Hemoglobin 11.9 (L) 13.5 - 16.5 GM/DL    Hematocrit 81.1 (L) 40 - 50 %    MCV 94.9 80 - 100 FL    MCH 32.5 26 - 34 PG    MCHC 34.3 32.0 - 36.0 G/DL    RDW 91.4 11 - 15 %    Platelet Count 213 150 - 400 K/UL    MPV 9.3 7 - 11 FL    Neutrophils 88 (H) 41 - 77 %    Lymphocytes 8 (L) 24 - 44 %    Monocytes 4 4 - 12 %    Eosinophils 0 0 - 5 %    Basophils 0 0 - 2 %    Absolute Neutrophil Count 21.33 (H) 1.8 - 7.0 K/UL    Absolute Lymph Count 1.95 1.0 - 4.8 K/UL    Absolute Monocyte Count 0.87 (H) 0 - 0.80 K/UL    Absolute Eosinophil Count 0.05 0 - 0.45 K/UL    Absolute Basophil Count 0.05 0 - 0.20 K/UL   COMPREHENSIVE METABOLIC PANEL    Collection Time: 11/24/18  4:39 AM   Result Value Ref Range Sodium 135 (L) 137 - 147 MMOL/L    Potassium 3.7 3.5 - 5.1 MMOL/L    Chloride 98 98 - 110 MMOL/L    Glucose 97 70 - 100 MG/DL    Blood Urea Nitrogen 43 (H) 7 - 25 MG/DL    Creatinine 7.82 0.4 - 1.24 MG/DL    Calcium 8.0 (L) 8.5 - 10.6 MG/DL    Total Protein 5.6 (L) 6.0 - 8.0 G/DL    Total Bilirubin 3.9 (H) 0.3 - 1.2 MG/DL    Albumin 2.6 (L) 3.5 - 5.0 G/DL    Alk Phosphatase 956 (H) 25 - 110 U/L    AST (SGOT) 64 (H) 7 - 40 U/L    CO2 26 21 - 30 MMOL/L    ALT (SGPT) 112 (H) 7 - 56 U/L    Anion Gap 11 3 - 12    eGFR Non African American >60 >60 mL/min    eGFR African American >60 >60 mL/min       Point of Care Testing  (Last 24 hours)  Glucose: 97 (11/24/18 0439)  POC Glucose (Download): (!) 107 (11/23/18 2122)    Radiology and other Diagnostics Review:    CXR 1 view 7/15  1. Heart size is mildly enlarged with mild pulmonary vascular congestion.  2. Small right pleural effusion with bibasilar opacities compatible with   atelectasis and/or pneumonia.    Keane Police, MD

## 2018-11-25 LAB — CULTURE-WOUND/TISSUE/FLUID(AEROBIC ONLY)W/SENSITIVITY

## 2018-11-25 LAB — COMPREHENSIVE METABOLIC PANEL
Lab: 3.8 mg/dL — ABNORMAL HIGH (ref 0.3–1.2)
Lab: 92 mg/dL — ABNORMAL HIGH (ref 70–100)

## 2018-11-25 LAB — CBC AND DIFF: Lab: 3.6 M/UL — ABNORMAL LOW (ref >60)

## 2018-11-25 LAB — PROTIME INR (PT): Lab: 1.3 % — ABNORMAL HIGH (ref >60)

## 2018-11-25 MED ORDER — SIMETHICONE 80 MG PO CHEW
80 mg | ORAL | 0 refills | Status: DC | PRN
Start: 2018-11-25 — End: 2018-12-01
  Administered 2018-11-25 – 2018-11-29 (×3): 80 mg via ORAL

## 2018-11-25 MED ORDER — PROMETHAZINE 25 MG PO TAB
25 mg | ORAL | 0 refills | Status: DC | PRN
Start: 2018-11-25 — End: 2018-11-28
  Administered 2018-11-26: 03:00:00 25 mg via ORAL

## 2018-11-25 MED ORDER — PANTOPRAZOLE 40 MG PO TBEC
40 mg | Freq: Two times a day (BID) | ORAL | 0 refills | Status: DC
Start: 2018-11-25 — End: 2018-12-01
  Administered 2018-11-25 – 2018-12-01 (×12): 40 mg via ORAL

## 2018-11-25 MED ORDER — POTASSIUM CHLORIDE 20 MEQ PO TBTQ
40 meq | Freq: Once | ORAL | 0 refills | Status: CP
Start: 2018-11-25 — End: ?
  Administered 2018-11-25: 15:00:00 40 meq via ORAL

## 2018-11-25 MED ORDER — GUAIFENESIN 600 MG PO TA12
600 mg | Freq: Two times a day (BID) | ORAL | 0 refills | Status: DC
Start: 2018-11-25 — End: 2018-11-26
  Administered 2018-11-25 – 2018-11-26 (×2): 600 mg via ORAL

## 2018-11-25 MED ORDER — ALUM-MAG HYDROXIDE-SIMETH 200-200-20 MG/5 ML PO SUSP
30 mL | ORAL | 0 refills | Status: DC
Start: 2018-11-25 — End: 2018-12-01
  Administered 2018-11-25 – 2018-12-01 (×18): 30 mL via ORAL

## 2018-11-25 NOTE — Progress Notes
General Progress Note    Name:  Tyler Castillo   NUUVO'Z Date:  11/25/2018  Admission Date: 11/20/2018  LOS: 5 days                     Assessment/Plan:    Principal Problem:    Liver mass  Active Problems:    Leucocytosis    83 y.o.?????????male???past medical history hepatic mass, hyperlipidemia ???who presents with ???leukocytosis, lethargy, increasing hepatic???mass size.  ???  Liver???mass, recurrent  Leukocytosis  Possible Community Acquired Pneumonia  -history of liver abscess in 2019, status post drainage and 6 weeks ertapenem, 2 weeks Augmentin  -No clear etiology found. Gram stain of perihepatic fluid GPC resembling strep  -Asymptomatic???in interim.?????????1wk lethargy, vague symptoms.??????OSH CT interval increase in size. ???Leukocytosis.  -AST/ALT/ALP???decreasing since admit: AST 129, ALT 223 and ALP 428 on 7/15???  -INR 1.4 7/15  -s/p Zosyn and 1L fluids.???  -Transferred???from OSH in [place]???for???higher level of care  -On arrival???VSS (slight tachycardia), 2/4 SIRS, pt NAD. ???Benign abdominal exam.  -7/15???blood cx NG2D  - CT Abd/Pelvis 7/14: 2 new adjacent hepatic abcesses/masses in cephalad liver (larger measuring up to 10cm) may reflect recurrent abscess but cholangiocarcinoma could appear similar; right UPJ calculus 6mm with upstream pelvocaliectasis without frank hydronephrosis. Adjacent urothelial thickening likely reflects inflammation (UA negative). 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  -??????IR???7/15???for left???liver abscess aspiration/drainage/biopsy, sent???fluid/tissue for routine, anaerobic, and fungal cultures  -??????IR???7/16???for right liver abscess aspiration/drainage/biopsy, sent???fluid/tissue for routine, anaerobic, and fungal cultures  - productive cough of grey sputum for 2 days duration - CXR 7/15 showed Small right pleural effusion with bibasilar opacities compatible with   atelectasis and/or pneumonia.  - 7/16 ERCP with small intrahepatic peripheral bile leak into abscess now s/p sphincterotomy   - 7/16 EUS antral lesions biopsied for h. Pylori  - left liver abscess fluid grew moderate fusobacterium and right abscess fluid grew light e. Coli   Plan  >Ertapenem 1g q24h???first day 7/13 (for liver abscesses and would also cover for a CAP)  > Hepatology consult:???vitamin K 10 mg IV x3 days, now complete  > ID consult: continue ertapenem (anticipate at least 1 month of antibiotic therapy), follow up cultures, monitor drain output, repeat CT abdomen when output from the drain slows  > f/u path from IR biopsy and drain placement on 7/15 and 7/16  > started RT protocol with IS and flutter, also prescribed mucinex for productive cough  > started protonix 40mg  PO BID for esophagitis per GI recs  ???  Right UPJ Nephrolithiasis  -9mm stone noted in R UPJ  -alternating R and L back pain, described as sharp and above the hip  -pt unaware of h/o stones. ???  - 7/13 UA negative for infection  Plan  > Follow up with urology as outpatient  ???  Chest Pain, resolved  RBBB  -c/o lower chest/upper abdomen bilateral sharp pain in band-like manner   -trops negative EKG RBBB at OSH. No ekg at [facility]  - previous EKG showing RBBB, so not new  - troponin 0.03 7/13  -???EKG???7/13: RBBB, inferior infarct, age indeterminate  - pain was actually more epigastric and resolved with GI cocktail  Plan  - continue to monitor  ???  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 ???Recommending inpatient setting for discharge  Code status:???DNAR-FI???(Discussed on 11/20/2018)??????  ???  ???  Patient seen & plan discussed with Dr.???Lutricia Horsfall, MD  Anesthesiology, PGY-1  Available on [facility]  Pager [phone] ________________________________________________________________________    Subjective  Tyler Castillo is a 84 y.o. male.  Patient endorses inability to get comfortable and boredom while in the hospital. He states that his pain is being managed. No subjective fever or chills overnight. No SOB or chest pain    Medications  Scheduled Meds:alum/mag hydroxide/simeth (MYLANTA) oral suspension 30 mL, 30 mL, Oral, Q4H  ertapenem (INVanz) IVP 1 g, 1 g, Intravenous, Q24H*  guaiFENesin LA (MUCINEX) tablet 600 mg, 600 mg, Oral, BID  lidocaine (LIDODERM) 5 % topical patch 1 patch, 1 patch, Topical, QDAY    And  Verification of Patch Placement and Integrity - Lidocaine 5%, , Transdermal, BID  pantoprazole DR (PROTONIX) tablet 40 mg, 40 mg, Oral, BID(11-21)  polyethylene glycol 3350 (MIRALAX) packet 17 g, 1 packet, Oral, QDAY  senna/docusate (SENOKOT-S) tablet 1 tablet, 1 tablet, Oral, BID  sodium chloride PF 0.9% syringe 10 mL, 10 mL, Intravenous, Q8H*    Continuous Infusions:  PRN and Respiratory Meds:fentaNYL citrate PF Q2H PRN, flumazeniL PRN, naloxone PRN, phenol PRN, prochlorperazine Q6H PRN, promethazine Q6H PRN, simethicone Q6H PRN      Review of Systems:  Review of Systems   Constitutional: Negative for chills and fever.   Respiratory: Negative for shortness of breath.    Cardiovascular: Negative for chest pain.   Gastrointestinal: Positive for abdominal pain (from sites of drains). Negative for constipation and diarrhea.        Hiccups   Genitourinary: Negative for dysuria.   Musculoskeletal: Negative for myalgias.   Skin: Negative for rash.   Neurological: Negative for dizziness and headaches.   Psychiatric/Behavioral: Negative for depression.     Objective:                          Vital Signs: Last Filed                 Vital Signs: 24 Hour Range   BP: 121/76 (07/18 1222)  Temp: 36.6 ???C (97.9 ???F) (07/18 1222)  Pulse: 87 (07/18 1222)  Respirations: 18 PER MINUTE (07/18 1222) SpO2: 97 % (07/18 1222) BP: (121-141)/(49-76)   Temp:  [36.6 ???C (97.9 ???F)-36.9 ???C (98.5 ???F)]   Pulse:  [62-92]   Respirations:  [14 PER MINUTE-18 PER MINUTE]   SpO2:  [93 %-98 %]      Vitals:    11/20/18 0132   Weight: 83.9 kg (185 lb)       Intake/Output Summary:  (Last 24 hours)    Intake/Output Summary (Last 24 hours) at 11/25/2018 1359  Last data filed at 11/25/2018 1222  Gross per 24 hour   Intake 720 ml   Output 560 ml   Net 160 ml      Stool Occurrence: 1    Physical Exam  Physical Exam   Constitutional: He is oriented to person, place, and time and well-developed, well-nourished, and in no distress.   HENT:   Head: Normocephalic and atraumatic.   Eyes: No scleral icterus.   Neck: Normal range of motion.   Cardiovascular: Normal rate and regular rhythm.   Pulmonary/Chest: Effort normal and breath sounds normal.   Abdominal: Soft. Bowel sounds are normal.   Drains x2 exiting at posterolateral and anterolateral left abdomen   Musculoskeletal:  General: No edema.   Neurological: He is alert and oriented to person, place, and time.   Skin: Skin is warm and dry.         Lab Review  24-hour labs:    Results for orders placed or performed during the hospital encounter of 11/20/18 (from the past 24 hour(s))   PROTIME INR (PT)    Collection Time: 11/25/18  4:37 AM   Result Value Ref Range    INR 1.3 (H) 0.8 - 1.2   CBC AND DIFF    Collection Time: 11/25/18  4:37 AM   Result Value Ref Range    White Blood Cells 21.3 (H) 4.5 - 11.0 K/UL    RBC 3.62 (L) 4.4 - 5.5 M/UL    Hemoglobin 11.7 (L) 13.5 - 16.5 GM/DL    Hematocrit 57.8 (L) 40 - 50 %    MCV 96.2 80 - 100 FL    MCH 32.2 26 - 34 PG    MCHC 33.5 32.0 - 36.0 G/DL    RDW 46.9 11 - 15 %    Platelet Count 299 150 - 400 K/UL    MPV 9.0 7 - 11 FL    Neutrophils 90 (H) 41 - 77 %    Lymphocytes 6 (L) 24 - 44 %    Monocytes 3 (L) 4 - 12 %    Eosinophils 0 0 - 5 %    Basophils 1 0 - 2 %    Absolute Neutrophil Count 19.09 (H) 1.8 - 7.0 K/UL Absolute Lymph Count 1.35 1.0 - 4.8 K/UL    Absolute Monocyte Count 0.71 0 - 0.80 K/UL    Absolute Eosinophil Count 0.08 0 - 0.45 K/UL    Absolute Basophil Count 0.10 0 - 0.20 K/UL   COMPREHENSIVE METABOLIC PANEL    Collection Time: 11/25/18  4:37 AM   Result Value Ref Range    Sodium 136 (L) 137 - 147 MMOL/L    Potassium 3.7 3.5 - 5.1 MMOL/L    Chloride 99 98 - 110 MMOL/L    Glucose 92 70 - 100 MG/DL    Blood Urea Nitrogen 34 (H) 7 - 25 MG/DL    Creatinine 6.29 0.4 - 1.24 MG/DL    Calcium 7.9 (L) 8.5 - 10.6 MG/DL    Total Protein 5.9 (L) 6.0 - 8.0 G/DL    Total Bilirubin 3.8 (H) 0.3 - 1.2 MG/DL    Albumin 2.8 (L) 3.5 - 5.0 G/DL    Alk Phosphatase 528 (H) 25 - 110 U/L    AST (SGOT) 112 (H) 7 - 40 U/L    CO2 26 21 - 30 MMOL/L    ALT (SGPT) 139 (H) 7 - 56 U/L    Anion Gap 11 3 - 12    eGFR Non African American >60 >60 mL/min    eGFR African American >60 >60 mL/min       Point of Care Testing  (Last 24 hours)  Glucose: 92 (11/25/18 0437)    Radiology and other Diagnostics Review:    ERCP 7/16: Initial attempts as cannulation with the   ??? ??? ??? ??? ??? ??? ??? ??? ??? ??? ??? ??? ??? ??? ??? DASH-25 and injection of contrast resulted in   ??? ??? ??? ??? ??? ??? ??? ??? ??? ??? ??? ??? ??? ??? ??? opacification of the bile duct . Then by   ??? ??? ??? ??? ??? ??? ??? ??? ??? ??? ??? ??? ??? ??? ??? redirecting the  sphincterotome deep selective   ??? ??? ??? ??? ??? ??? ??? ??? ??? ??? ??? ??? ??? ??? ??? cannulation of the CBD was achieved. Injection   ??? ??? ??? ??? ??? ??? ??? ??? ??? ??? ??? ??? ??? ??? ??? of contrast revealed a 5 mm CBD with no distal   ??? ??? ??? ??? ??? ??? ??? ??? ??? ??? ??? ??? ??? ??? ??? filling defect suggestive of stone. A guide   ??? ??? ??? ??? ??? ??? ??? ??? ??? ??? ??? ??? ??? ??? ??? wire was passed into the intrahepatics and a 7   ??? ??? ??? ??? ??? ??? ??? ??? ??? ??? ??? ??? ??? ??? ??? mm traction biliary sphincterotomy was   ??? ??? ??? ??? ??? ??? ??? ??? ??? ??? ??? ??? ??? ??? ??? performed. There was no bleeding. Then a stone   ??? ??? ??? ??? ??? ??? ??? ??? ??? ??? ??? ??? ??? ??? ??? extraction balloon was swept multiple times   ??? ??? ??? ??? ??? ??? ??? ??? ??? ??? ??? ??? ??? ??? ??? through the duct no stone was extracted. Marland Kitchen ??? ??? ??? ??? ??? ??? ??? ??? ??? ??? ??? ??? ??? ??? ??? Following this a balloon occlusion   ??? ??? ?????? ??? ??? ??? ??? ??? ??? ??? ??? ??? ??? ??? ???cholangiogram was performed and did showe small   ??? ??? ??? ??? ??? ??? ??? ??? ??? ??? ??? ??? ??? ??? ??? intrahepatic ducts with a connection between   ??? ??? ??? ??? ??? ??? ??? ??? ??? ??? ??? ??? ??? ??? ??? the right anterior duct with one of the   ??? ??? ??? ??? ??? ??? ??? ??? ??? ??? ??? ??? ??? ??? ??? abscess. The scope was withdrawn after   ??? ??? ??? ??? ??? ??? ??? ??? ??? ??? ??? ??? ??? ??? ??? decompressing the stomach and duodenum and the   ??? ??? ??? ??? ??? ??? ??? ??? ??? ??? ??? ??? ??? ??? ??? procedure was terminated. Good flow of bile was   ??? ??? ??? ??? ??? ??? ??? ??? ??? ??? ??? ??? ??? ??? ??? seen.   EUS 7/16: - The celiac axis was visualized and no celiac   ??? ??? ??? ??? ??? ??? ??? ??? ??? ??? ??? ??? ??? ??? ??? nodes were seen.   ??? ??? ??? ??? ??? ??? ??? ??? ??? ??? ??? ??? ??? ??? ??? - There was no evidence of significant   ??? ??? ??? ??? ??? ??? ??? ??? ??? ??? ??? ??? ??? ??? ??? pathology in the left lobe of the liver.   ??? ??? ??? ??? ??? ??? ??? ??? ??? ??? ??? ??? ??? ??? ??? - There was no sign of significant pathology in   ??? ??? ??? ??? ??? ??? ??? ??? ??? ??? ??? ??? ??? ??? ??? the entire pancreas.   ??? ??? ??? ??? ??? ??? ??? ??? ??? ??? ?????? ??? ??? ??? ???- The ampulla was normal appearing.   ??? ??? ??? ??? ??? ??? ??? ??? ??? ??? ??? ??? ??? ??? ??? - Gallbladder stones   ??? ??? ??? ??? ??? ??? ??? ??? ??? ??? ??? ??? ??? ??? ??? -Grade D esophagitis   ??? ??? ??? ??? ??? ??? ??? ??? ??? ??? ??? ??? ??? ??? ??? -Antral erosions s/p biopsy for H.pylori  Howard Pouch, MD   Pager

## 2018-11-25 NOTE — Progress Notes
Infectious Disease Progress Note    Today's Date:  11/25/2018  Admission Date: 11/20/2018    Reason for this consultation: Patient with history of hepatic mass of unclear etiology with prior cultures growing GPC resembling strep. Treated with ertapenem. Patient presenting with new similar appearing lesion. Please assist with antibiotic recommendations.    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 No viable tumor cells are present. ???The finding may represent reaction adjacent to mass lesion, or may represent a liver abscess.  -10/26/17 normal liver biopsy: Mild sinusoidal dilation and changes consistent with biliary obstruction. No significant fibrosis.   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 Review of outside CT by IR - felt significantly improved and drain could be removed   -11/21/17 IR drain removal. Abscessogram- no fistula -12/01/17 finished ertapenem and started amox/clav, which he finished ~12/17/17  -12/22/17 another 14-day course of augmentin prescribed  -11/19/18 patient presented to [facility] ED with weakness, malaise, right-sided chest discomfort  -11/19/18 CT abdomen/pelvis: 7.2 x 8.9 cm heterogenous mass within hepatic dome posterior segment of R hepatic lobe; increased in size from prior study (July 19); interval development of 2.9x 3.0cm heterogenous mass within L hepatic lobe; interval development of mild (R) sided hydronephrosis secondary to 9mm calculus of right UPJ  -7/12 CXR: Cardiomegaly, chronic small right pleural effusion, right basilar atelectasis  -Leukocytosis and elevated liver enzymes  -7/13 BC x 2 sets NTD  -7/13 fungal blood cultures x2 sets NTD  -7/14 CT abdomen and pelvis: There is persistent cholelithiasis. The hepatic abscess previously demonstrated in the caudal right lobe adjacent to the gallbladder is not distinctly visualized and has likely resolved. Development of 2 new adjacent hepatic low-density lesions in the cephalad liver. The larger is centered in hepatic segments 7/8 and measures approximately 10 x 7 x 9 cm (axial image 17 and coronal image 87). The adjacent smaller abscess is within hepatic segment 4A and measures approximately 5 x 3.5 x 4.5 cm (axial image 15 and coronal image 50).  -7/15 IR placed a drain in the smaller left-sided hepatic abscess; 20 mL of bloody purulent fluid was aspirated.  No biopsy done and no drain placed in the large right-sided hepatic abscess  -7/15 smaller left-sided hepatic abscess fluid GS: Many PMN, no org; routine culture NTD.  Anaerobic culture + MG Fusobacterium nucleatum  -7/15 fluid cytology: Acute inflammation and debris. Negative for malignant cells.   -7/16 large right-sided hepatic abscess fluid GS: Many PMN, no org; cultures + E coli  -7/16 ERCP: Injection of  contrast revealed a 5 mm CBD with no distal filling defect suggestive of stone. A guide wire was passed into the intrahepatics and a 7 mm traction biliary sphincterotomy was performed. There was no bleeding. Then a stone extraction balloon was swept multiple times through the duct no stone was extracted. Following this???a balloon occlusion cholangiogram was performed and did showe small intrahepatic ducts with a connection between the right anterior duct with one of the abscess.    Right hydronephrosis  Right UPJ nephrolithiasis  -11/19/2018 CT abdomen and pelvis: 9mm calculus of right UPJ  -7/14 CT abdomen and pelvis: Right ureteropelvic junction calculus measuring 6 mm with mild upstream pelvocaliectasis without frank hydronephrosis. Adjacent urothelial thickening likely reflects inflammation. Persistent small left lower pole nonobstructing renal calculus.    History of AKI- resolved    History of Viral pericarditis status-post pericardiectomy in 1993    Hyperlipidemia    Former tobacco use (quit 1978)    Recommendations:     1. Continue ertapenem   2. Anticipate at least 1 month of IV antibiotic therapy  3. Follow-up cultures   4. Monitor drains' output  5. Patient will need repeat CT abdomen when the output from the drain slowed down  6. Monitor temp and WBC count    We'll follow  __________________________________________________________________________________    Subjective/Interval History     Patient remains afebrile  Not feeling good this morning  Cannot find a good position to be comfortable  Also has been having frequent hiccups   Denies having chest or back pain  No SOB   Cough improved, nonproductive now  No nausea, vomiting or diarrhea  No dysuria???    WBC 21.3 <--24.3 <--23.6 <--22.1 <--17.8  Hb 11.7  Platelets 299  Creatinine 0.99  AST 112??? increased  ALT 139??? increased  ALP 613??? increased  T bili 3.8??? improved some    Drains output in the past 24 hours:   #1: 210 mL (flushed with 20 mL)  #2: 120 mL (flushed with 20 mL)    ??? Antimicrobial Start date End date   Zosyn   6/17 x 1 dose; 6/19-6/25/19   11/19/2018 x1 dose ???   Ertapenem  6/25-7/25/19; 11/20/2018  active   Augmentin  7/25-8/10; 12/22/17 ???01/05/18   ??? ??? ???   ??? ??? ???   ??? ??? ???   ??? ??? ???   Estimated Creatinine Clearance: 60.6 mL/min (based on SCr of 0.99 mg/dL).    Medications   Scheduled Meds:ertapenem (INVanz) IVP 1 g, 1 g, Intravenous, Q24H*  lidocaine (LIDODERM) 5 % topical patch 1 patch, 1 patch, Topical, QDAY    And  Verification of Patch Placement and Integrity - Lidocaine 5%, , Transdermal, BID  pantoprazole DR (PROTONIX) tablet 40 mg, 40 mg, Oral, BID(11-21)  polyethylene glycol 3350 (MIRALAX) packet 17 g, 1 packet, Oral, QDAY  potassium chloride SR (K-DUR) tablet 40 mEq, 40 mEq, Oral, ONCE  senna/docusate (SENOKOT-S) tablet 1 tablet, 1 tablet, Oral, BID  sodium chloride PF 0.9% syringe 10 mL, 10 mL, Intravenous, Q8H*    Continuous Infusions:    PRN and Respiratory Meds:fentaNYL citrate PF Q2H PRN, flumazeniL PRN, naloxone PRN, phenol PRN, prochlorperazine Q6H PRN, simethicone Q6H PRN      Allergies   No Known Allergies      Physical Examination                          Vital Signs: Last  Vital Signs: 24 Hour Range   BP: 133/49 (07/18 0808)  Temp: 36.8 ???C (98.2 ???F) (07/18 1610)  Pulse: 75 (07/18 0808)  Respirations: 16 PER MINUTE (07/18 0808)  SpO2: 93 % (07/18 0808) BP: (125-141)/(49-74)   Temp:  [36.7 ???C (98 ???F)-36.9 ???C (98.5 ???F)]   Pulse:  [62-84]   Respirations:  [14 PER MINUTE-17 PER MINUTE]   SpO2:  [93 %-98 %]      General appearance: Alert, lethargic, in NAD  HENT: No thrush, coated tongue  Lungs: Decreased breath sounds over lower lungs posteriorly with occasional fine crackles, otherwise clear  Heart: Regular rhythm, reg rate, no murmur, rub, gallop  Abdomen: Soft, non-tender, non-distended, J-Vac drain in the right upper lateral abdomen draining dark bloody fluid, J-Vac drain in the right upper quadrant draining thin sanguinous fluid, normoactive bowel sounds  Ext: No clubbing, cyanosis; mild bilateral lower extremity edema  Skin: No rash    Lines: PIV      Lab Review   Hematology  Recent Labs     11/23/18  0456 11/24/18  0439 11/25/18  0437   WBC 23.6* 24.3* 21.3*   HGB 11.3* 11.9* 11.7*   HCT 32.2* 34.6* 34.8*   PLTCT 173 213 299   INR 1.5* 1.5* 1.3*     Chemistry  Recent Labs     11/23/18  0456 11/24/18  0439 11/25/18  0437   NA 135* 135* 136*   K 3.7 3.7 3.7   CL 99 98 99   CO2 27 26 26    BUN 48* 43* 34*   CR 1.02 0.97 0.99   GFR >60 >60 >60   GLU 109* 97 92   CA 8.3* 8.0* 7.9*   ALBUMIN 2.5* 2.6* 2.8*   ALKPHOS 411* 386* 613*   AST 99* 64* 112*   ALT 162* 112* 139*   TOTBILI 2.7* 3.9* 3.8*       Microbiology, Radiology and other Diagnostics Review   Microbiology data reviewed.    Pertinent radiology images viewed.    7/15 CXR:  1. Heart size is mildly enlarged with mild pulmonary vascular congestion.  2. Small right pleural effusion with bibasilar opacities compatible with   atelectasis and/or pneumonia.    7/14 CT abdomen/pelvis:  1. Development of 2 new adjacent hepatic abscess/masses in the cephalad   liver, as described, the larger measuring up to 10 cm. Given prior   clinical history, these likely reflect recurrent abscesses however   underlying hepatic mass such as cholangiocarcinoma could appear similar.  2. Right ureteropelvic junction calculus measuring 6 mm with mild upstream   pelvocaliectasis without frank hydronephrosis. Adjacent urothelial   thickening likely reflects inflammation. Urinalysis is recommended.  3. Persistent small left lower pole nonobstructing renal calculus.  4. Marked sigmoid colon diverticulosis.  5. 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      Theotis Burrow, MD  Division of Infectious Diseases   Pager [phone]

## 2018-11-26 LAB — CULTURE-BLOOD W/SENSITIVITY

## 2018-11-26 LAB — CBC AND DIFF: Lab: 17 K/UL — ABNORMAL HIGH (ref 4.5–11.0)

## 2018-11-26 LAB — COMPREHENSIVE METABOLIC PANEL
Lab: 136 MMOL/L — ABNORMAL LOW (ref 137–147)
Lab: 4 MMOL/L — ABNORMAL LOW (ref 3.5–5.1)

## 2018-11-26 MED ORDER — PEG-ELECTROLYTE SOLN 420 GRAM PO SOLR
4 L | Freq: Once | ORAL | 0 refills | Status: CP
Start: 2018-11-26 — End: ?
  Administered 2018-11-26: 23:00:00 4 L via ORAL

## 2018-11-26 MED ORDER — BACLOFEN 10 MG PO TAB
10 mg | Freq: Three times a day (TID) | ORAL | 0 refills | Status: DC | PRN
Start: 2018-11-26 — End: 2018-11-28
  Administered 2018-11-26 – 2018-11-28 (×2): 10 mg via ORAL

## 2018-11-26 MED ORDER — SODIUM CHLORIDE 0.9 % FLUSH
5-10 mL | Freq: Three times a day (TID) | 0 refills | Status: DC
Start: 2018-11-26 — End: 2018-12-01

## 2018-11-26 MED ORDER — GUAIFENESIN 100 MG/5 ML PO LIQD
100 mg | ORAL | 0 refills | Status: DC | PRN
Start: 2018-11-26 — End: 2018-11-29
  Administered 2018-11-26: 18:00:00 100 mg via ORAL

## 2018-11-26 MED ORDER — PEG-ELECTROLYTE SOLN 420 GRAM PO SOLR
4 L | ORAL | 0 refills | Status: DC
Start: 2018-11-26 — End: 2018-11-28
  Administered 2018-11-27: 02:00:00 4 L via ORAL

## 2018-11-26 MED ORDER — BISACODYL 5 MG PO TBEC
10 mg | Freq: Once | ORAL | 0 refills | Status: AC | PRN
Start: 2018-11-26 — End: ?

## 2018-11-26 MED ORDER — BENZONATATE 100 MG PO CAP
100 mg | Freq: Three times a day (TID) | ORAL | 0 refills | Status: DC | PRN
Start: 2018-11-26 — End: 2018-12-01
  Administered 2018-11-26 – 2018-11-30 (×3): 100 mg via ORAL

## 2018-11-26 MED ORDER — PEG-ELECTROLYTE SOLN 420 GRAM PO SOLR
2 L | ORAL | 0 refills | Status: DC | PRN
Start: 2018-11-26 — End: 2018-11-28

## 2018-11-26 NOTE — Procedures
PICC Line Insertion Procedure Note    NAME:Dmitri SALVADOR COUPE                                             MRN: [id number]                 DOB:04/15/1935          AGE: 84 y.o.  ADMISSION DATE: 11/20/2018             DAYS ADMITTED: LOS: 6 days      Procedure Details: Informed consent was obtained for the procedure.  Risks of infection, blood clot, and nerve or vessel damage were discussed.     Indications: Home IV therapy     Procedure: Under sterile conditions the skin at the insertion site was prepped with chlorhexadineand covered with a sterile drape. Local anesthesia was applied to the skin and subcutaneous tissues.  A #4 FR, Single Lumen, PICC was inserted in theRight Brachial vein per hospital protocol. Blood return:  Yes Catheter trimmed@38cm , inserted to 38 cm, with 0 cm external.  Catheter was flushed with 20 mL NS. Patient did tolerate procedure well. Mid upper arm circumference is 28 cm.Vein size 3.45mm 34%    Verification:Placement confirmed with ECG., Patency verified by positive blood return., Venous location confirmed by ultrasound., Surveyor, quantity given to patient and/or left at bedside. and Piccline confirmation CAJ per green diamond and per green 3CG   In the physical presence of D. Broyles, RN, I have taken down these notes, Tedd Sias. 11/26/2018

## 2018-11-26 NOTE — Progress Notes
Shift: NOC, shift Tmax 98.49F    Pain: denies - however, awake most of night w/ GI discomfort, wet/minimally productive cough (using throat spray, dosed mucinex), persistent hiccups, spoke w/ provider, order placed for PRN baclofen, dosed, pt later stated it only helped for awhile    Nutrition: regular, all meals prior to shift; good PO liquid intake    GI/GU: LBM 7/18, during day; minimal UOP, dark yellow/amber, clear; JVACs draining appropriately; minor nausea at bedtime, gave phenergan, resolved; no v/d    Activity: w/ 2-3 + walker, pt very weak and unsteady on feet, sat in chair beginning of shift, in bed for most of night, sitting on side of bed using tray table toward morning    Family: pt's son visits; wife is in memory care, pt has not seen her in 5 months d/t facility's isolation; used to attend church, I asked if he'd like chaplain to stop by, he accepted, order placed    Follow-up:  ---~0100 -- pt asked, "do people get hallucinations here?" pt states he saw people in his room who were talking, but not to him, only new med was phenergan, denies seeing or hearing anyone rest of shift; pt is claustrophobic and does sleep with door open  ---continue to monitor hiccups, GI discomfort, cough

## 2018-11-26 NOTE — Progress Notes
General Progress Note    Name:  Tyler Castillo   [id number] Date:  11/26/2018  Admission Date: 11/20/2018  LOS: 6 days                     Assessment/Plan:    Principal Problem:    Liver mass  Active Problems:    Leucocytosis    83 y.o.?????????male???past medical history hepatic mass, hyperlipidemia ???who presents with ???leukocytosis, lethargy, increasing hepatic???mass size.  ???  Liver???mass, recurrent  Leukocytosis  Possible Community Acquired Pneumonia  -history of liver abscess in 2019, status post drainage and 6 weeks ertapenem, 2 weeks Augmentin  -No clear etiology found. Gram stain of perihepatic fluid GPC resembling strep  -Asymptomatic???in interim.?????????1wk lethargy, vague symptoms.??????OSH CT interval increase in size. ???Leukocytosis.  -AST/ALT/ALP???decreasing since admit: AST 129, ALT 223 and ALP 428 on 7/15???  -INR 1.4 7/15  -s/p Zosyn and 1L fluids.???  -Transferred???from OSH in [place]???for???higher level of care  -On arrival???VSS (slight tachycardia), 2/4 SIRS, pt NAD. ???Benign abdominal exam.  -7/15???blood cx NG2D  - CT Abd/Pelvis 7/14: 2 new adjacent hepatic abcesses/masses in cephalad liver (larger measuring up to 10cm) may reflect recurrent abscess but cholangiocarcinoma could appear similar; right UPJ calculus 6mm with upstream pelvocaliectasis without frank hydronephrosis. Adjacent urothelial thickening likely reflects inflammation (UA negative). 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  -??????IR???7/15???for left???liver abscess aspiration/drainage/biopsy, sent???fluid/tissue for routine, anaerobic, and fungal cultures  -??????IR???7/16???for right liver abscess aspiration/drainage/biopsy, sent???fluid/tissue for routine, anaerobic, and fungal cultures  - productive cough of grey sputum for 2 days duration - CXR 7/15 showed Small right pleural effusion with bibasilar opacities compatible with   atelectasis and/or pneumonia.  - 7/16 ERCP with small intrahepatic peripheral bile leak into abscess now s/p sphincterotomy???  - 7/16 EUS antral lesions biopsied for h. Pylori  - left liver abscess fluid grew moderate fusobacterium and right abscess fluid grew light e. Coli   - patient received picc line today in preparation for discharge to SNF for IV antibiotics  Plan  >Ertapenem 1g q24h???first day 7/13 (for liver abscesses and would also cover for a CAP)  > Hepatology consult:???vitamin K 10 mg IV x3 days, now complete  > ID consult: continue ertapenem (anticipate at least 1 month of antibiotic therapy), follow up cultures, monitor drain output, repeat CT abdomen when output from the drain slows; ID not seeing him today, will come by tomorrow and assess whether or not he is ready for repeat imaging  > started RT protocol with IS and flutter, also prescribed tessalon perles and robitussun for productive cough  > started protonix 40mg  PO BID for esophagitis per GI recs  ???  Right UPJ Nephrolithiasis  -9mm stone noted in R UPJ  -alternating R and L back pain, described as sharp and above the hip  -pt unaware of h/o stones. ???  - 7/13 UA negative for infection  Plan  >???Follow up with urology as outpatient  ???  Chest Pain, resolved  RBBB  -c/o lower chest/upper abdomen bilateral sharp pain in band-like manner   -trops negative EKG RBBB at OSH. No ekg at [place]  - previous EKG showing RBBB, so not new  - troponin 0.03 7/13  -???EKG???7/13: RBBB, inferior infarct, age indeterminate  - pain was actually more epigastric and resolved with GI cocktail  Plan  - continue to monitor  ???  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 ???Recommending inpatient setting for discharge  Code status:???DNAR-FI???(Discussed on 11/20/2018)??????  ???  ??? Patient seen & plan discussed with Dr.???Opole  ???  Howard Pouch, MD  Anesthesiology, PGY-1  Available on [facility]  Pager [phone]  ________________________________________________________________________    Subjective  Tyler Castillo is a 84 y.o. male.  Patient still having a lot of hiccups this morning. He states that they are not improved with the medications that were added on yesterday (mylanta, prochlorperazine).     Medications  Scheduled Meds:alum/mag hydroxide/simeth (MYLANTA) oral suspension 30 mL, 30 mL, Oral, Q4H  ertapenem (INVanz) IVP 1 g, 1 g, Intravenous, Q24H*  guaiFENesin LA (MUCINEX) tablet 600 mg, 600 mg, Oral, BID  lidocaine (LIDODERM) 5 % topical patch 1 patch, 1 patch, Topical, QDAY    And  Verification of Patch Placement and Integrity - Lidocaine 5%, , Transdermal, BID  pantoprazole DR (PROTONIX) tablet 40 mg, 40 mg, Oral, BID(11-21)  polyethylene glycol 3350 (MIRALAX) packet 17 g, 1 packet, Oral, QDAY  senna/docusate (SENOKOT-S) tablet 1 tablet, 1 tablet, Oral, BID  sodium chloride PF 0.9% syringe 10 mL, 10 mL, Intravenous, Q8H*    Continuous Infusions:  PRN and Respiratory Meds:baclofen TID PRN, fentaNYL citrate PF Q2H PRN, flumazeniL PRN, naloxone PRN, phenol PRN, prochlorperazine Q6H PRN, promethazine Q6H PRN, simethicone Q6H PRN      Review of Systems:  Review of Systems   Constitutional: Negative for chills and fever.   Respiratory: Positive for cough (productive of grey sputum, improving). Negative for shortness of breath.    Cardiovascular: Negative for chest pain.   Gastrointestinal: Negative for abdominal pain, constipation and diarrhea.        + hiccups   Genitourinary: Negative for dysuria.   Musculoskeletal: Negative for myalgias.   Skin: Negative for rash.   Neurological: Negative for dizziness and headaches.   Psychiatric/Behavioral: Negative for depression.         Objective:                          Vital Signs: Last Filed                 Vital Signs: 24 Hour Range BP: 136/52 (07/19 0835)  Temp: 36.4 ???C (97.5 ???F) (07/19 9604)  Pulse: 64 (07/19 0835)  Respirations: 20 PER MINUTE (07/19 0835)  SpO2: 95 % (07/19 0835) BP: (121-150)/(43-76)   Temp:  [36.4 ???C (97.5 ???F)-36.9 ???C (98.4 ???F)]   Pulse:  [64-92]   Respirations:  [18 PER MINUTE-22 PER MINUTE]   SpO2:  [94 %-97 %]      Vitals:    11/20/18 0132   Weight: 83.9 kg (185 lb)       Intake/Output Summary:  (Last 24 hours)    Intake/Output Summary (Last 24 hours) at 11/26/2018 0904  Last data filed at 11/26/2018 0835  Gross per 24 hour   Intake 1410 ml   Output 975 ml   Net 435 ml      Stool Occurrence: 1    Physical Exam  Physical Exam   Constitutional: He is oriented to person, place, and time and well-developed, well-nourished, and in no distress.   HENT:   Head: Normocephalic and atraumatic.   Eyes: No scleral icterus.   Neck: Normal range of motion.   Cardiovascular: Normal rate and regular rhythm.   Pulmonary/Chest: Effort normal and breath sounds normal.  Abdominal: Soft. Bowel sounds are normal.   2 hepatic abscess drains from anterolateral and posterolateral left abdomen   Musculoskeletal:         General: No edema.   Neurological: He is alert and oriented to person, place, and time.   Skin: Skin is warm and dry.         Lab Review  24-hour labs:    Results for orders placed or performed during the hospital encounter of 11/20/18 (from the past 24 hour(s))   CBC AND DIFF    Collection Time: 11/26/18  5:11 AM   Result Value Ref Range    White Blood Cells 17.8 (H) 4.5 - 11.0 K/UL    RBC 3.22 (L) 4.4 - 5.5 M/UL    Hemoglobin 10.4 (L) 13.5 - 16.5 GM/DL    Hematocrit 16.1 (L) 40 - 50 %    MCV 95.3 80 - 100 FL    MCH 32.3 26 - 34 PG    MCHC 33.9 32.0 - 36.0 G/DL    RDW 09.6 11 - 15 %    Platelet Count 285 150 - 400 K/UL    MPV 8.7 7 - 11 FL    Neutrophils 87 (H) 41 - 77 %    Lymphocytes 8 (L) 24 - 44 %    Monocytes 5 4 - 12 %    Eosinophils 0 0 - 5 %    Basophils 0 0 - 2 %    Absolute Neutrophil Count 15.39 (H) 1.8 - 7.0 K/UL Absolute Lymph Count 1.37 1.0 - 4.8 K/UL    Absolute Monocyte Count 0.91 (H) 0 - 0.80 K/UL    Absolute Eosinophil Count 0.06 0 - 0.45 K/UL    Absolute Basophil Count 0.06 0 - 0.20 K/UL   COMPREHENSIVE METABOLIC PANEL    Collection Time: 11/26/18  5:11 AM   Result Value Ref Range    Sodium 136 (L) 137 - 147 MMOL/L    Potassium 4.0 3.5 - 5.1 MMOL/L    Chloride 101 98 - 110 MMOL/L    Glucose 95 70 - 100 MG/DL    Blood Urea Nitrogen 28 (H) 7 - 25 MG/DL    Creatinine 0.45 0.4 - 1.24 MG/DL    Calcium 7.4 (L) 8.5 - 10.6 MG/DL    Total Protein 5.5 (L) 6.0 - 8.0 G/DL    Total Bilirubin 3.2 (H) 0.3 - 1.2 MG/DL    Albumin 2.5 (L) 3.5 - 5.0 G/DL    Alk Phosphatase 409 (H) 25 - 110 U/L    AST (SGOT) 87 (H) 7 - 40 U/L    CO2 26 21 - 30 MMOL/L    ALT (SGPT) 123 (H) 7 - 56 U/L    Anion Gap 9 3 - 12    eGFR Non African American >60 >60 mL/min    eGFR African American >60 >60 mL/min       Point of Care Testing  (Last 24 hours)  Glucose: 95 (11/26/18 0511)    Radiology and other Diagnostics Review:    No pertinent radiology.    Howard Pouch, MD   Pager

## 2018-11-27 ENCOUNTER — Encounter: Admit: 2018-11-27 | Discharge: 2018-11-27

## 2018-11-27 DIAGNOSIS — R160 Hepatomegaly, not elsewhere classified: Secondary | ICD-10-CM

## 2018-11-27 LAB — CBC AND DIFF
Lab: 2.8 M/UL — ABNORMAL LOW (ref 4.4–5.5)
Lab: 34 g/dL — ABNORMAL LOW (ref 32.0–36.0)

## 2018-11-27 LAB — CULTURE-ANAEROBIC

## 2018-11-27 LAB — COMPREHENSIVE METABOLIC PANEL
Lab: 139 MMOL/L — ABNORMAL HIGH (ref >60)
Lab: 2.4 mg/dL — ABNORMAL HIGH (ref 0.3–1.2)

## 2018-11-27 LAB — CULTURE-WOUND/TISSUE/FLUID(AEROBIC ONLY)W/SENSITIVITY

## 2018-11-27 MED ORDER — PERFLUTREN LIPID MICROSPHERES 1.1 MG/ML IV SUSP
1-20 mL | Freq: Once | INTRAVENOUS | 0 refills | Status: CP | PRN
Start: 2018-11-27 — End: ?
  Administered 2018-11-27: 20:00:00 2 mL via INTRAVENOUS

## 2018-11-27 MED ORDER — SODIUM CHLORIDE 0.9 % IJ SOLN
50 mL | Freq: Once | INTRAVENOUS | 0 refills | Status: CP
Start: 2018-11-27 — End: ?
  Administered 2018-11-28: 04:00:00 50 mL via INTRAVENOUS

## 2018-11-27 MED ORDER — IOHEXOL 350 MG IODINE/ML IV SOLN
100 mL | Freq: Once | INTRAVENOUS | 0 refills | Status: CP
Start: 2018-11-27 — End: ?
  Administered 2018-11-28: 04:00:00 100 mL via INTRAVENOUS

## 2018-11-27 NOTE — Progress Notes
General Progress Note    Name:  Tyler Castillo   [id number] Date:  11/27/2018  Admission Date: 11/20/2018  LOS: 7 days                     Assessment/Plan:    Principal Problem:    Liver mass  Active Problems:    Leucocytosis    83 y.o.?????????male???past medical history hepatic mass, hyperlipidemia ???who presents with ???leukocytosis, lethargy, increasing hepatic???mass size.  ???  Liver???mass, recurrent  Leukocytosis  -history of liver abscess in 2019, status post drainage and 6 weeks ertapenem, 2 weeks Augmentin  -No clear etiology found. Gram stain of perihepatic fluid GPC resembling strep  -Asymptomatic???in interim.?????????1wk lethargy, vague symptoms.??????OSH CT interval increase in size. ???Leukocytosis.  -AST/ALT/ALP???decreasing since admit  -Transferred???from OSH in [place]???for???higher level of care  -On arrival???VSS (slight tachycardia), 2/4 SIRS, pt NAD. ???Benign abdominal exam.  - CT Abd/Pelvis 7/14: 2 new adjacent hepatic abcesses/masses in cephalad liver (larger measuring up to 10cm)  -??????IR???bx for L and R hepatic abscesses 7/16 and 7/17  - CXR 7/15 showed Small right pleural effusion with bibasilar opacities compatible with   atelectasis and/or pneumonia; clinically correlates with productive cough  - 7/16 ERCP with small intrahepatic peripheral bile leak into abscess now s/p sphincterotomy???  - 7/16 EUS antral lesions biopsied for h. Pylori  - left liver abscess fluid grew moderate fusobacterium and right abscess fluid grew light e. Coli???  - patient received picc line 7/19 in preparation for discharge to SNF for IV antibiotics  Plan  >Ertapenem 1g q24h???first day 7/13; may need to check with ID to see if there are other antibiotic possibilities because it is unlikely that he will be accepted to a SNF on this antibiotic  > ID consult: continue ertapenem???(anticipate at least 1 month of antibiotic therapy), follow up cultures, monitor drain output, repeat CT abdomen when output from the drain slows; ID not seeing him today, will follow up ID recs on repeat imaging  > started RT protocol with IS and flutter, also prescribed tessalon perles and robitussun for productive cough  > started protonix 40mg  PO BID for esophagitis per GI recs  > hepatology wanted colonoscopy to evaluate for source of abscess, patient refused bowel prep last night, he had one 3 years ago.  > barrier to discharge is antibiotic plan and lab monitoring as well as repeat imaging  ???  Right UPJ Nephrolithiasis  -9mm stone noted in R UPJ  -alternating R and L back pain, described as sharp and above the hip  -pt unaware of h/o stones. ???  - 7/13 UA negative for infection  Plan  >???Follow up with urology as outpatient  ???  Chest Pain, resolved  RBBB  -c/o lower chest/upper abdomen bilateral sharp pain in band-like manner   -trops negative EKG RBBB at OSH. No ekg at [place]  - previous EKG showing RBBB, so not new  - troponin 0.03 7/13  -???EKG???7/13: RBBB, inferior infarct, age indeterminate  - pain was actually more epigastric and resolved with GI cocktail  Plan  - continue to monitor  ???  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 ???Recommending inpatient setting for discharge  Code status:???DNAR-FI???(Discussed on 11/20/2018)??????  ???  ???  Patient seen & plan discussed with Dr. Orson Ape  ???  Howard Pouch, MD  Anesthesiology, PGY-1  Available on [facility]  Pager [phone]  ________________________________________________________________________    Subjective  Tyler Sessions  Castillo is a 84 y.o. male.  Patient had a rough time with bowel prep last night in preparation for colonoscopy today. He states that he made a big mess and was very sick to his stomach, having diarrhea and vomiting. These symptoms are improved this morning. He continues to have constant hiccups, making it difficult for him to converse.    Medications Scheduled Meds:alum/mag hydroxide/simeth (MYLANTA) oral suspension 30 mL, 30 mL, Oral, Q4H  ertapenem (INVanz) IVP 1 g, 1 g, Intravenous, Q24H*  lidocaine (LIDODERM) 5 % topical patch 1 patch, 1 patch, Topical, QDAY    And  Verification of Patch Placement and Integrity - Lidocaine 5%, , Transdermal, BID  pantoprazole DR (PROTONIX) tablet 40 mg, 40 mg, Oral, ZOX(09-60)  peg-electrolyte solution (NULYTELY) oral solution 4 L, 4 L, Oral, As Prescribed  polyethylene glycol 3350 (MIRALAX) packet 17 g, 1 packet, Oral, QDAY  senna/docusate (SENOKOT-S) tablet 1 tablet, 1 tablet, Oral, BID  sodium chloride PF 0.9% flush 5-10 mL, 5-10 mL, Flush, FLUSH TID  sodium chloride PF 0.9% syringe 10 mL, 10 mL, Intravenous, Q8H*    Continuous Infusions:  PRN and Respiratory Meds:baclofen TID PRN, benzonatate TID PRN, fentaNYL citrate PF Q2H PRN, flumazeniL PRN, guaiFENesin Q4H PRN, naloxone PRN, peg-electrolyte solution PRN (On Call from Rx), phenol PRN, prochlorperazine Q6H PRN, promethazine Q6H PRN, simethicone Q6H PRN      Review of Systems:  Review of Systems   Constitutional: Negative for chills and fever.   Respiratory: Negative for shortness of breath.    Cardiovascular: Negative for chest pain.   Gastrointestinal: Negative for abdominal pain, constipation and diarrhea.        Hiccups   Genitourinary: Negative for dysuria.   Musculoskeletal: Negative for myalgias.   Skin: Negative for rash.   Neurological: Negative for dizziness and headaches.   Psychiatric/Behavioral: Negative for depression.         Objective:                          Vital Signs: Last Filed                 Vital Signs: 24 Hour Range   BP: 140/51 (07/20 0804)  Temp: 36.3 ???C (97.4 ???F) (07/20 4540)  Pulse: 76 (07/20 0804)  Respirations: 16 PER MINUTE (07/20 0804)  SpO2: 95 % (07/20 0804) BP: (121-149)/(51-56)   Temp:  [36.2 ???C (97.2 ???F)-36.7 ???C (98.1 ???F)]   Pulse:  [61-80]   Respirations:  [16 PER MINUTE-20 PER MINUTE]   SpO2:  [92 %-99 %] Intensity Pain Scale (Self Report): 0 (11/26/18 0900) Vitals:    11/20/18 0132   Weight: 83.9 kg (185 lb)       Intake/Output Summary:  (Last 24 hours)    Intake/Output Summary (Last 24 hours) at 11/27/2018 0829  Last data filed at 11/27/2018 0804  Gross per 24 hour   Intake 2340 ml   Output 1485 ml   Net 855 ml      Stool Occurrence: 0    Physical Exam  Physical Exam   Constitutional: He is oriented to person, place, and time and well-developed, well-nourished, and in no distress.   HENT:   Head: Normocephalic and atraumatic.   Eyes: No scleral icterus.   Neck: Normal range of motion.   Cardiovascular: Normal rate and regular rhythm.   Pulmonary/Chest: Effort normal and breath sounds normal.   Abdominal: Soft. Bowel sounds are normal.   Hepatic drains x2  on left anterolateral and posterolateral abdomen.   Musculoskeletal:         General: No edema.   Neurological: He is alert and oriented to person, place, and time.   Skin: Skin is warm and dry.       Lab Review  24-hour labs:    Results for orders placed or performed during the hospital encounter of 11/20/18 (from the past 24 hour(s))   CBC AND DIFF    Collection Time: 11/27/18  4:06 AM   Result Value Ref Range    White Blood Cells 18.2 (H) 4.5 - 11.0 K/UL    RBC 2.85 (L) 4.4 - 5.5 M/UL    Hemoglobin 9.3 (L) 13.5 - 16.5 GM/DL    Hematocrit 16.1 (L) 40 - 50 %    MCV 95.5 80 - 100 FL    MCH 32.8 26 - 34 PG    MCHC 34.3 32.0 - 36.0 G/DL    RDW 09.6 11 - 15 %    Platelet Count 325 150 - 400 K/UL    MPV 8.5 7 - 11 FL    Neutrophils 91 (H) 41 - 77 %    Lymphocytes 5 (L) 24 - 44 %    Monocytes 4 4 - 12 %    Eosinophils 0 0 - 5 %    Basophils 0 0 - 2 %    Absolute Neutrophil Count 16.54 (H) 1.8 - 7.0 K/UL    Absolute Lymph Count 0.90 (L) 1.0 - 4.8 K/UL    Absolute Monocyte Count 0.75 0 - 0.80 K/UL    Absolute Eosinophil Count 0.01 0 - 0.45 K/UL    Absolute Basophil Count 0.02 0 - 0.20 K/UL   COMPREHENSIVE METABOLIC PANEL    Collection Time: 11/27/18  4:06 AM Result Value Ref Range    Sodium 139 137 - 147 MMOL/L    Potassium 4.4 3.5 - 5.1 MMOL/L    Chloride 103 98 - 110 MMOL/L    Glucose 91 70 - 100 MG/DL    Blood Urea Nitrogen 24 7 - 25 MG/DL    Creatinine 0.45 0.4 - 1.24 MG/DL    Calcium 7.2 (L) 8.5 - 10.6 MG/DL    Total Protein 5.3 (L) 6.0 - 8.0 G/DL    Total Bilirubin 2.4 (H) 0.3 - 1.2 MG/DL    Albumin 2.3 (L) 3.5 - 5.0 G/DL    Alk Phosphatase 409 (H) 25 - 110 U/L    AST (SGOT) 60 (H) 7 - 40 U/L    CO2 26 21 - 30 MMOL/L    ALT (SGPT) 89 (H) 7 - 56 U/L    Anion Gap 10 3 - 12    eGFR Non African American >60 >60 mL/min    eGFR African American >60 >60 mL/min       Point of Care Testing  (Last 24 hours)  Glucose: 91 (11/27/18 0406)    Radiology and other Diagnostics Review:    No pertinent radiology.    Howard Pouch, MD   Pager

## 2018-11-27 NOTE — Progress Notes
Infectious Disease Progress Note    Today's Date:  11/27/2018  Admission Date: 11/20/2018    Reason for this consultation: Patient with history of hepatic mass of unclear etiology with prior cultures growing GPC resembling strep. Treated with ertapenem. Patient presenting with new similar appearing lesion. Please assist with antibiotic recommendations.    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 No viable tumor cells are present. ???The finding may represent reaction adjacent to mass lesion, or may represent a liver abscess.  -10/26/17 normal liver biopsy: Mild sinusoidal dilation and changes consistent with biliary obstruction. No significant fibrosis.   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 Review of outside CT by IR - felt significantly improved and drain could be removed   -11/21/17 IR drain removal. Abscessogram- no fistula -12/01/17 finished ertapenem and started amox/clav, which he finished ~12/17/17  -12/22/17 another 14-day course of augmentin prescribed  -11/19/18 patient presented to [facility] ED with weakness, malaise, right-sided chest discomfort  -11/19/18 CT abdomen/pelvis: 7.2 x 8.9 cm heterogenous mass within hepatic dome posterior segment of R hepatic lobe; increased in size from prior study (July 19); interval development of 2.9x 3.0cm heterogenous mass within L hepatic lobe; interval development of mild (R) sided hydronephrosis secondary to 9mm calculus of right UPJ  -7/12 CXR: Cardiomegaly, chronic small right pleural effusion, right basilar atelectasis  -Leukocytosis and elevated liver enzymes  -7/13 BC x 2 sets NTD  -7/13 fungal blood cultures x2 sets NTD  -7/14 CT abdomen and pelvis: There is persistent cholelithiasis. The hepatic abscess previously demonstrated in the caudal right lobe adjacent to the gallbladder is not distinctly visualized and has likely resolved. Development of 2 new adjacent hepatic low-density lesions in the cephalad liver. The larger is centered in hepatic segments 7/8 and measures approximately 10 x 7 x 9 cm (axial image 17 and coronal image 87). The adjacent smaller abscess is within hepatic segment 4A and measures approximately 5 x 3.5 x 4.5 cm (axial image 15 and coronal image 50).  -7/15 IR placed a drain in the smaller left-sided hepatic abscess; 20 mL of bloody purulent fluid was aspirated.  No biopsy done and no drain placed in the large right-sided hepatic abscess  -7/15 smaller left-sided hepatic abscess fluid GS: Many PMN, no org; routine culture NTD.  Anaerobic culture + MG Fusobacterium nucleatum  -7/15 fluid cytology: Acute inflammation and debris. Negative for malignant cells.   -7/16 large right-sided hepatic abscess fluid GS: Many PMN, no org; routine culture + E coli.  Anaerobic culture +  LG Fusobacterium nucleatum -7/16 ERCP: Injection of contrast revealed a 5 mm CBD with no distal filling defect suggestive of stone. A guide wire was passed into the intrahepatics and a 7 mm traction biliary sphincterotomy was performed. There was no bleeding. Then a stone extraction balloon was swept multiple times through the duct no stone was extracted. Following this???a balloon occlusion cholangiogram was performed and did showe small intrahepatic ducts with a connection between the right anterior duct with one of the abscess.    Right hydronephrosis  Right UPJ nephrolithiasis  -11/19/2018 CT abdomen and pelvis: 9mm calculus of right UPJ  -7/14 CT abdomen and pelvis: Right ureteropelvic junction calculus measuring 6 mm with mild upstream pelvocaliectasis without frank hydronephrosis. Adjacent urothelial thickening likely reflects inflammation. Persistent small left lower pole nonobstructing renal calculus.    History of AKI- resolved    History of Viral pericarditis status-post pericardiectomy in 1993    Hyperlipidemia    Former tobacco use (quit 1978)    Recommendations:     1. Continue ertapenem   2. Anticipate at least 1 month of IV antibiotic therapy  3. Repeat CT abdomen since the drains' output slowed down  4. Given that the patient has no clear etiology of recurrent liver abscesses, I think he should be evaluated for cholecystectomy in the future due to gallstones  5. Follow-up cultures   6. Monitor drains' output  7. Monitor temp and WBC count    We'll follow  __________________________________________________________________________________    Subjective/Interval History     Patient remains afebrile  He continues to have hiccups  He was supposed to have colonoscopy today  He could not tolerate the prep  Had nausea and vomiting and decided to cancel the procedure  Occasional SOB   No cough   Had semi-formed stools this morning  No abdominal pain  No dysuria???    WBC 18.2  Hb 9.3  Platelets 325  Creatinine 0.92  AST 60-improved ALT 89-improved  ALP 575-stable  T bili 2.4-improved    Drains output in the past 24 hours:   #1:  40 mL (flushed with 20 mL)  #2: 0 mL (flushed with 20 mL)    Discussed with Dr. Orson Ape and his team.  ???  Antimicrobial Start date End date   Zosyn   6/17 x 1 dose; 6/19-6/25/19   11/19/2018 x1 dose ???   Ertapenem  6/25-7/25/19; 11/20/2018  active   Augmentin  7/25-8/10; 12/22/17 ???01/05/18   ??? ??? ???   ??? ??? ???   ??? ??? ???   ??? ??? ???   Estimated Creatinine Clearance: 65.2 mL/min (based on SCr of 0.92 mg/dL).    Medications   Scheduled Meds:alum/mag hydroxide/simeth (MYLANTA) oral suspension 30 mL, 30 mL, Oral, Q4H  ertapenem (INVanz) IVP 1 g, 1 g, Intravenous, Q24H*  lidocaine (LIDODERM) 5 % topical patch 1 patch, 1 patch, Topical, QDAY    And  Verification of Patch Placement and Integrity - Lidocaine 5%, , Transdermal, BID  pantoprazole DR (PROTONIX) tablet 40 mg, 40 mg, Oral, ZOX(09-60)  peg-electrolyte solution (NULYTELY) oral solution 4 L, 4 L, Oral, As Prescribed  polyethylene glycol 3350 (MIRALAX) packet 17 g, 1 packet, Oral, QDAY  senna/docusate (SENOKOT-S) tablet 1 tablet, 1 tablet, Oral, BID  sodium chloride PF 0.9% flush 5-10 mL, 5-10 mL, Flush, FLUSH TID  sodium chloride PF 0.9% syringe 10 mL, 10 mL, Intravenous, Q8H*    Continuous Infusions:    PRN and Respiratory Meds:baclofen TID PRN, benzonatate TID  PRN, fentaNYL citrate PF Q2H PRN, flumazeniL PRN, guaiFENesin Q4H PRN, naloxone PRN, peg-electrolyte solution PRN (On Call from Rx), phenol PRN, prochlorperazine Q6H PRN, promethazine Q6H PRN, simethicone Q6H PRN      Allergies   No Known Allergies      Physical Examination                          Vital Signs: Last                  Vital Signs: 24 Hour Range   BP: 140/51 (07/20 0804)  Temp: 36.3 ???C (97.4 ???F) (07/20 1610)  Pulse: 76 (07/20 0804)  Respirations: 16 PER MINUTE (07/20 0804)  SpO2: 95 % (07/20 0804) BP: (121-149)/(51-56)   Temp:  [36.2 ???C (97.2 ???F)-36.7 ???C (98.1 ???F)]   Pulse:  [61-80] Respirations:  [16 PER MINUTE-20 PER MINUTE]   SpO2:  [92 %-99 %]      General appearance: Alert, lethargic, in NAD  HENT: No thrush, coated tongue  Lungs: Clear breath sounds anteriorly  Heart: Regular rhythm, reg rate, no murmur, rub, gallop  Abdomen: Soft, non-tender, non-distended, J-Vac drain in the right upper lateral abdomen, J-Vac drain in the right upper quadrant   Ext: No clubbing, cyanosis; mild bilateral lower extremity edema  Skin: No rash    Lines:   R arm PICC line 7/19  PIV      Lab Review   Hematology  Recent Labs     11/25/18  0437 11/26/18  0511 11/27/18  0406   WBC 21.3* 17.8* 18.2*   HGB 11.7* 10.4* 9.3*   HCT 34.8* 30.7* 27.2*   PLTCT 299 285 325   INR 1.3*  --   --      Chemistry  Recent Labs     11/25/18  0437 11/26/18  0511 11/27/18  0406   NA 136* 136* 139   K 3.7 4.0 4.4   CL 99 101 103   CO2 26 26 26    BUN 34* 28* 24   CR 0.99 0.96 0.92   GFR >60 >60 >60   GLU 92 95 91   CA 7.9* 7.4* 7.2*   ALBUMIN 2.8* 2.5* 2.3*   ALKPHOS 613* 574* 575*   AST 112* 87* 60*   ALT 139* 123* 89*   TOTBILI 3.8* 3.2* 2.4*       Microbiology, Radiology and other Diagnostics Review   Microbiology data reviewed.    Pertinent radiology images viewed.    7/15 CXR:  1. Heart size is mildly enlarged with mild pulmonary vascular congestion.  2. Small right pleural effusion with bibasilar opacities compatible with   atelectasis and/or pneumonia.    7/14 CT abdomen/pelvis:  1. Development of 2 new adjacent hepatic abscess/masses in the cephalad   liver, as described, the larger measuring up to 10 cm. Given prior   clinical history, these likely reflect recurrent abscesses however   underlying hepatic mass such as cholangiocarcinoma could appear similar.  2. Right ureteropelvic junction calculus measuring 6 mm with mild upstream   pelvocaliectasis without frank hydronephrosis. Adjacent urothelial   thickening likely reflects inflammation. Urinalysis is recommended. 3. Persistent small left lower pole nonobstructing renal calculus.  4. Marked sigmoid colon diverticulosis.  5. 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      Theotis Burrow, MD  Division of Infectious Diseases  Pager [phone]

## 2018-11-27 NOTE — Progress Notes
PHYSICAL THERAPY  PROGRESS NOTE        Name: Tyler Castillo        MRN: [id number]          DOB: [birth year]          Age: 84 y.o.  Admission Date: 11/20/2018             LOS: 7 days        Mobility  Progressive Mobility Level: Walk in hallway  Distance Walked (feet): 360 ft  Level of Assistance: Assist X1  Assistive Device: Walker  Time Tolerated: 11-30 minutes  Activity Limited By: Fatigue    Subjective  Significant hospital events: Admitted for recurrent liver mass (drain placed 7/15), R nephrolithiasis, and weakness/malaise.  Recent admission to OSH for dehydration (7/9-7/9).  PMH:  PMH, liver mass (10/2017).  Mental / Cognitive Status: Alert;Oriented;Cooperative  Persons Present: Occupational Therapist  Pain: Patient has no complaint of pain  Pain Interventions: Patient assisted into position of comfort;Patient agrees to participate in therapy  Ambulation Assist: Independent Mobility in [employer] without Device  Patient Owned Equipment: None  Home Situation: Lives Alone  Type of Home: House  Entry Stairs: 1-2 Stairs;Rail on Both Sides  In-Home Stairs: Able to Live on One Level  Comments: Reports fall 2-3 wks. ago but no other incidents in the last year.  Son/DIL nearby and assist with meal prep and community mobility, as needed.       Bed Mobility/Transfer  Bed Mobility: Supine to Sit: Standby Assist;Head of Bed Elevated;Use of Rail;Verbal Cues  Transfer Type: Sit to Stand  Transfer: Assistance Level: From;Bed;Minimal Assist  Transfer: Assistive Device: Nurse, adult  Transfers: Type Of Assistance: For Balance;For Strength Deficit;Verbal Cues  End Of Activity Status: Up in Chair;Nursing Notified;Instructed Patient to Request Assist with Mobility;Instructed Patient to Use Call Light    Balance  Standing Balance: Static Standing Balance;Dynamic Standing Balance;2 UE support;Minimal Assist    Gait  Gait Distance: 360 feet  Gait: Assistance Level: Minimal Assist  Gait: Assistive Device: Roller Walker Gait: Descriptors: Decreased foot clearance RLE;Decreased foot clearance LLE;Pace: Slow;Decreased knee flexion in pre-swing RLE;Decreased knee flexion in pre-swing  LLE;Decreased step length;Variable step length  Comments: 2nd person for chair follow however did not need. Initial shuffle gait with moderate assist for walker management. Once in hall, improved stride with swing through however several incidents of unsteadiness and minor walker deviation.   Activity Limited By: Weakness     Education  Persons Educated: Patient  Patient Barriers To Learning: None Noted  Interventions: Repetition of Instructions  Teaching Methods: Verbal Instruction  Patient Response: Verbalized Understanding;More Instruction Required  Topics: Plan/Goals of PT Interventions;Use of Assistive Device/Orthosis;Mobility Progression;Safety Awareness;Up with Assist Only;Importance of Increasing Activity;Positioning;Recommend Continued Therapy    Assessment/Progress  Impaired Mobility Due To: Decreased Strength;Impaired Balance;Deconditioning  Assessment/Progress: Should Improve w/ Continued PT  Comments: Patient progressing to x1 assist however still demonstrates strength and endurance deficits as well as impaired balance warrenting need for further skilled PT intervention.     AM-PAC 6 Clicks Basic Mobility Inpatient  Turning from your back to your side while in a flat bed without using bed rails: A Little  Moving from lying on your back to sitting on the side of a flatbed without using bedrails : A Little  Moving to and from a bed to a chair (including a wheelchair): A Little  Standing up from a chair using your arms (e.g. wheelchair, or bedside chair): A Little  To walk  in hospital room: A Little  Climbing 3-5 steps with a railing: A Lot  Raw Score: 17  Standardized (T-scale) Score: 39.67  Basic Mobility CMS 0-100%: 43.83  CMS G Code Modifier for Basic Mobility: CK    Goals  Goal Formulation: With Patient  Time For Goal Achievement: 7 days Patient Will Go Supine To/From Sit: w/ Stand By Assist, Ongoing  Patient Will Transfer Sit to Stand: w/ Stand By Assist, Ongoing  Patient Will Ambulate: Greater than 200 Feet, w/ Stand By Assist, Ongoing(least restrictive device)  Patient Will Go Up / Down Stairs: 1-2 Stairs, w/ Stand By Assist, Ongoing    Plan  Treatment Interventions: Mobility Training;Strengthening;Balance Activities  Plan Frequency: 5 Days per Week  PT Plan for Next Visit: Progress gait. Sit to stand repetitions.     PT Discharge Recommendations  Recommendation: Inpatient setting    Therapist: Particia Jasper  Date: 11/27/2018

## 2018-11-27 NOTE — Progress Notes
OCCUPATIONAL THERAPY  PROGRESS NOTE      Name: Tyler Castillo        MRN: [id number]          DOB: 31-May-1934          Age: 84 y.o.  Admission Date: 11/20/2018             LOS: 7 days      Mobility  Progressive Mobility Level: Walk in hallway  Distance Walked (feet): 360 ft  Level of Assistance: Assist X1  Assistive Device: Walker  Time Tolerated: 11-30 minutes  Activity Limited By: Fatigue    Subjective  Pertinent Dx per Physician: PMH-hepatic mass, hyperlipidemia  who presents with  leukocytosis, lethargy, increasing hepatic mass size. Found to have abcess. Right neph tube placed 7/15 and left to be placed 7/16.  Precautions: Falls  Pain / Complaints: Patient agrees to participate in therapy    Objective  Psychosocial Status: Willing and Cooperative to Participate  Persons Present: Physical Therapist    Home Living  Type of Home: House    Prior Function  Level Of Independence: Independent with ADLs and functional transfers;Independent with homemaking w/ ambulation  Lives With: Alone  Receives Help From: None Needed    ADL's  Where Assessed: Standing at [employer] Assist: Stand By Assist  Grooming Deficits: Teeth Care;Supervision/Safety;Standing With Assistive Device    ADL Mobility  Bed Mobility: Supine to Sit: Standby assist  Bed Mobility Comments: HOB elevated  Transfer Type: Sit to stand;Stand to sit  Transfer: Assistance Level: To/from;Bed;Bedside chair;Minimal assist  Transfer: Assistive Device: Roller walker  Transfer: Type of Assistance: For balance;For safety considerations;For strength deficit;Requires extra time  End of Activity Status: Up in chair;Nursing notified;Instructed patient to use call light  Transfer Comments: Required verbal cues to push from chair rather than pull on walker handles. Verbal cue to reach back before sitting to ensure chair is appropriately positioned.  Gait Distance: 360 feet  Gait: Assistance Level: Minimal assist  Gait: Assistive Device: Roller walker Gait Comments: Pt ambulates with LE externally rotated and narrow BOS and his heels therefore hit each other at times- pt has minor LOB x2 during ambulation with MinA to recover. Chair follow for ambulation this date for safety, but pt does not utilize. Following ambulation, pt states I have a tendency to over-do things--although he does not appear overtly fatigued.     Cognition  Overall Cognitive Status: WFL to Adequately Complete Self Care Tasks Safely  Comprehension: Hard of Hearing  Expression: WFL Adequate to Meet Daily Needs  Social Interaction: Interacts in a Spontaneous,Cooperative Manner  Cognition Comment: Pt is very pleasant and cooperative.     Education  Goal Formulation: With Patient    Assessment  Assessment: Decreased ADL Status;Decreased Endurance;Decreased Self-Care Trans;Decreased High-Level ADLs  Prognosis: Good;w/Cont OT s/p Acute Discharge  Goal Formulation: Patient  Comments: Patient making significant improvements in mobility and endurance. Anticipate ongoing progress towards prior level of function with additional therapeutic intervention.    AM-PAC 6 Clicks Daily Activity Inpatient  Putting on and taking off regular lower body clothes?: A Lot  Bathing (Including washing, rinsing, drying): A Lot  Toileting, which includes using toilet, bedpan, or urinal: A Little  Putting on and taking off regular upper body clothing: None  Taking care of personal grooming such as brushing teeth: A Little  Eating meals?: None  Daily Activity Raw Score: 18  Standardized (t-scale) score: 38.66  CMS 0-100% Score: 46.65  CMS G Code  Modifier: CK    Plan  Progress: Progressing Toward Goals  OT Frequency: 3-5x/week  OT Plan for Next Visit: Progress mobility, Ambulate to bathroom for toileting, standing at sink and LB dressing.    Functional Transfer Goals  Pt Will Perform All Functional Transfers: Minimum Assist    OT Discharge Recommendations  Recommendation: Inpatient setting Patient Currently Requires Physical Assist With: All mobility;All personal care ADLs;All home functioning ADLs    Therapist: Mosetta Putt, OTR/L [id number]  Date: 11/27/2018

## 2018-11-27 NOTE — Progress Notes
Gastroenterology Consult Note  Patient Name:Tyler Castillo         ZOX:[id number]  Admission Date: 11/20/2018  1:16 AM      Principal Problem:    Liver mass  Active Problems:    Leucocytosis      History of Present Illness/Subjective:  Tyler Castillo is a 84 y.o. male with history of liver abscess versus infected mass, pericarditis s/p pericardectomy in 1993 presented to OSH 7/12 for 1 week of general fatigue, dizziness.  Hepatology was consulted when imaging showed increase in liver lesion previously consistent with abscess.    Feeling much better except for hiccups which have not abated for the last 5 days interfering with his sleep and other daily activities.  Otherwise he feels well denies any chills, rigors, confusion, abdominal pain aside from where the drains are inserted, diarrhea.    Assessment/ Plan:    Hepatic abscesses  MRI 10/27/2017 segment five 9 cm mass.  Underwent percutaneous drainage, drain left in place.  Biopsy 10/26/2017 showed necrosis, mixed inflammatory infiltrate; blood cultures grew fusobacterium and strep (from fluid aspirate adjacent to mass); amebiasis negative.  Drain removed 11/21/2017, negative abscessogram.  Finished ertapenem 12/01/2017, Augmentin finish 12/17/2017.  CT 12/2017 showed decrease in size of abscess measuring 5.8 x 5.1 cm (previously 9.0 x 7.0 cm).   1. Liver Bx 10/26/17: The native liver biopsy shows mild sinusoidal dilation. ???The portal triads show mild neutrophilic inflammation and a bile ductular reaction, consistent with biliary outflow obstruction. ???These findings are often seen adjacent to a mass lesion. ???There is no evidence of chronic or acute hepatitis. ???There is no significant fibrosis.   2. 2nd admission CT: abscess from 10/2017 appeared resolved. 7.2 x 8.9 cm mass in the hepatic dome posterior segment of right hepatic lobe, interval development of 2.9 x 3.0 heterogeneous mass in the left hepatic lobe  3. S/p IR drain placement 7/15 (bloody, purulent fluid) 4. S/p second IR drain placement 7/16 in larger lesion; no solid lesion to biopsy  5. EUS/ERCP 7/16 showed small intrahepatics, peripheral bile leak into abscess cavity s/p sphincterotomy (no stent placed)  6. Colonoscopy report 2017 reviewed: Dense sigmoid diverticulosis with tortuosity (recommended pediatric scope on repeat), internal hemorrhoids otherwise normal  7. Drain 1 growing fusobacterium, drain 2 growing E coli.   LA D esophagitis  Diverticulosis  Delirium Periprocedural, after sedation    Recommendations:  Improvement in transaminases. Bile leak s/p sphincterotomy.  Drain from abscess #1 growing fusobacterium, drain from abscess #2 growing E. coli.  Source could be colonic in origin, especially given his dense sigmoid diverticulosis.     -Hiccups are likely related to the drainage catheter abutting the diaphragm and it may need to be removed or repositioned.  Could try Thorazine as a one-time dose to see if this helps.   -He would like to defer colonoscopy (vomited the prep) which I believe is reasonable at this point.  He had an exam 3 years ago.  -Continue ertapenem, appreciate infectious disease recommendations; at this point barrier to discharge is antibiotic plan and lab monitoring; will defer to them for timing of repeat imaging  -PO PPI twice daily until next endoscopy in 3 months for esophagitis    Otherwise we plan to sign off.  The patient does not need hepatology follow-up necessarily unless there is concern for a solid tissue mass on his repeat imaging.     Future Appointments   Date Time Provider Department Center   12/13/2018  9:00 AM  Clarita Crane, MD, [facility] Urology       Patient seen/discussed with Dr. Livia Snellen Marthenia Rolling, MD  GI/Hepatology Fellow [phone]    -----------------------------  PMH:  Medical History:   Diagnosis Date   ??? Hyperlipidemia    ??? Renal failure        Current medications:  No current facility-administered medications on file prior to encounter. Current Outpatient Medications on File Prior to Encounter   Medication Sig Dispense Refill   ??? acetaminophen (TYLENOL) 500 mg tablet Take 500 mg by mouth every 6 hours as needed for Pain. Max of 4,000 mg of acetaminophen in 24 hours.     ??? aspirin EC 81 mg tablet Take 81 mg by mouth daily. Take with food.     ??? Calcium Carbonate-Mag Hydroxid (ROLAIDS) 550-110 mg chew Chew 1-2 tablets by mouth as Needed.     ??? diphenhydrAMINE HCL (ZZZQUIL) 50 mg/30 mL liqd Take 10 mL by mouth at bedtime as needed.     ??? vitamins, multiple cap Take 1 capsule by mouth daily.         PSH:  Surgical History:   Procedure Laterality Date   ??? ESOPHAGOGASTRODUODENOSCOPY WITH CONTROL OF BLEEDING - FLEXIBLE N/A 10/31/2017    Performed by Buckles, Vinnie Level, MD at [facility] ENDO   ??? ENDOSCOPIC RETROGRADE CHOLANGIOPANCREATOGRAPHY WITH SPHINCTEROTOMY/ PAPILLOTOMY N/A 11/23/2018    Performed by Comer Locket, MD at [facility] ENDO   ??? ESOPHAGOGASTRODUODENOSCOPY WITH ENDOSCOPIC ULTRASOUND EXAMINATION - FLEXIBLE N/A 11/23/2018    Performed by Comer Locket, MD at [facility] ENDO   ??? ESOPHAGOGASTRODUODENOSCOPY WITH BIOPSY - FLEXIBLE  11/23/2018    Performed by Comer Locket, MD at [facility] ENDO   ??? HX APPENDECTOMY     ??? LUNG SURGERY         SH:  Social History     Socioeconomic History   ??? Marital status: Married     Spouse name: Not on file   ??? Number of children: 1   ??? Years of education: Not on file   ??? Highest education level: Not on file   Occupational History   ??? Not on file   Tobacco Use   ??? Smoking status: Former Smoker     Packs/day: 0.50     Years: 20.00     Pack years: 10.00     Types: Cigarettes     Last attempt to quit: 10/24/1977     Years since quitting: 41.1   ??? Smokeless tobacco: Never Used   Substance and Sexual Activity   ??? Alcohol use: Yes   ??? Drug use: Never   ??? Sexual activity: Not on file   Other Topics Concern   ??? Not on file   Social History Narrative   ??? Not on file       FH:  Family History   Problem Relation Age of Onset   ??? Stroke Mother Review of Systems:  Constitutional: No fevers, chills, weight loss  Eyes: No vision deficit, no icterus  Ears, nose, mouth: No oral bleeding, ulcer  Cardiovascular: No chest pain, palpitations  Respiratory: No dyspnea, cough   Gastrointestinal: No abdominal pain, blood in stool   Musculoskeltal: No joint inflammation, new deformity  Integumentary: No rashes, exudate  Neurologic: No cognitive change, focal weakness  Hematologic: No for easy bleeding or bruising  Please see HPI for additional pertinent documentation    Physical Exam:  Vitals:    11/26/18 2200 11/27/18 0417 11/27/18  0804 11/27/18 1152   BP:  (!) 146/56 (!) 140/51 123/43   BP Source:   Arm, Left Upper Arm, Left Upper   Pulse: 76 80 76 61   Temp:  36.3 ???C (97.4 ???F) 36.3 ???C (97.4 ???F) 36.3 ???C (97.4 ???F)   SpO2: 92% 93% 95% 97%   Weight:       Height:         Constitutional- Vitals above, no acute distress.   Head - Normocephalic, atraumatic.   Eyes -  EOMI grossly. No icterus or injection.   Ears, nose, mouth, throat- No oral ulcer or bleeding.   Neck - No swelling or tracheal deviation.   Respiratory - Symmetric chest rise, no increased work of breathing.  Cardiovascular - Peripheral pulses intact, no pedal edema.  Gastrointestinal- Non-TTP. No hepatospenomegaly.  Drain x2; yellow/sanguinous output  Skin - No exposed rash, lesion.  Neurologic - No CN deficit, normal fluid speech  Psychiatric - Judgement intact, thought content appropriate.    Labs/Imaging:  Pertient labs/imaging were reviewed on initiation of progress note.

## 2018-11-27 NOTE — Care Plan
Problem: Infection, Risk of  Goal: Absence of infection  Outcome: Goal Ongoing  Flowsheets (Taken 11/27/2018 0647)  Absence of infection:   Assess for infection (Monitor SIRS Criteria)   Implement prevention measures as indicated   Monitor for signs and symptoms of infection     Problem: Falls, High Risk of  Goal: Absence of falls-Adult Patient  Outcome: Goal Ongoing  Flowsheets (Taken 11/27/2018 0647)  Absence of falls-Adult Patient:   Complete Fall Risk Assessment.   Consider additional interventions if patient is confused, has gait/balance problems and on high risk medications.   Provide fall prevention strategies.   Provide safe ambulation.   Provde safe environment.   Implement fall risk bundle.     Problem: Discharge Planning  Goal: Participation in plan of care  Outcome: Goal Ongoing  Flowsheets (Taken 11/27/2018 0647)  Participation in Plan of Care: Involve patient/caregiver in care planning decision making  Goal: Knowledge regarding plan of care  Outcome: Goal Ongoing  Flowsheets (Taken 11/27/2018 0647)  Knowledge regarding plan of care:   Provide admission education to parent/caregiver   Provide fall prevention education   Provide VTE signs and symptoms education   Provide pre-operative teaching   Provide infection prevention education   Provide plan of care education   Provide procedural and treatment education   Provide medication management education  Goal: Prepared for discharge  Outcome: Goal Ongoing  Flowsheets (Taken 11/27/2018 0647)  Prepared for discharge:   Complete ADL ability assessment   Provide safe use medical equipment education   Provide diet and oral health education   Collaborate with multidisciplinary team for hospital discharge coordination     Problem: Pain  Goal: Management of pain  Outcome: Goal Ongoing  Flowsheets (Taken 11/27/2018 0647)  Management of pain:   Complete pain assessment scale according to age, condition and ability to understand.   Assess opioid analgesia side-effects. Assess pain control barriers.   Manage pain.   In the patient who can fully report pain, assess pain characteristics.  Goal: Knowledge of pain management  Outcome: Goal Ongoing  Flowsheets (Taken 11/27/2018 [phone])  Knowledge of pain management:   Provide pain scale education   Provide pain management methods education   Provide pharmacological pain management education  Goal: Progress Toward Pain Management Goals  Outcome: Goal Ongoing  Flowsheets (Taken 11/27/2018 0647)  Progress toward pain management goals:   Progress toward pain management goals   Assess progress toward pain management goals     Problem: Mobility/Activity Intolerance  Goal: Maximize functional ADL's and mobility outcomes  Outcome: Goal Ongoing  Flowsheets (Taken 11/27/2018 0647)  Maximize functional ADLs and mobility outcomes:   Administer oxygen to maintain SpO2 at approprate levels   Maintain body position   Manage environmental safety     Problem: Self-Care Deficit  Goal: Maximize ADL functioning  Outcome: Goal Ongoing     Problem: Infection, Risk of, Central Venous Catheter-Associated Bloodstream Infection  Goal: Absence of CVC Associated Bloodstream infection  Outcome: Goal Ongoing  Flowsheets (Taken 11/27/2018 0647)  Absence of CVC associated bloodstream  infection:   Assess for central line catheter infection (Monitor SIRS criteria)   Manage central venous catheter   Provide education on central line home care   Follow central line bundle components   Preparation for central venous catheter insertion

## 2018-11-27 NOTE — Care Plan
Problem: Infection, Risk of  Goal: Absence of infection  Outcome: Goal Ongoing  Remains afebrile. CVC care completed.     Problem: Falls, High Risk of  Goal: Absence of falls-Adult Patient  Outcome: Goal Ongoing  Fall bundle in place.      Problem: Discharge Planning  Goal: Participation in plan of care  Outcome: Goal Ongoing  Plans to discharge to home when stable.   Goal: Knowledge regarding plan of care  Outcome: Goal Ongoing  Goal: Prepared for discharge  Outcome: Goal Ongoing     Problem: Pain  Goal: Management of pain  Outcome: Goal Ongoing  Reports pain well controlled.   Goal: Knowledge of pain management  Outcome: Goal Ongoing  Goal: Progress Toward Pain Management Goals  Outcome: Goal Ongoing     Problem: Mobility/Activity Intolerance  Goal: Maximize functional ADL's and mobility outcomes  Outcome: Goal Ongoing     Problem: Self-Care Deficit  Goal: Maximize ADL functioning  Outcome: Goal Ongoing     Problem: Infection, Risk of, Central Venous Catheter-Associated Bloodstream Infection  Goal: Absence of CVC Associated Bloodstream infection  Outcome: Goal Ongoing

## 2018-11-27 NOTE — Case Management (ED)
Case Management Progress Note    NAME:Tyler Castillo                          MRN: [id number]              DOB:10-23-1934          AGE: 84 y.o.  ADMISSION DATE: 11/20/2018             DAYS ADMITTED: LOS: 7 days      Todays Date: 11/27/2018    Plan  Anticipate d/c to SNF pending facility acceptance and medical stability.    Interventions  ? Support   Support: Pt/Family Updates re:POC or DC Plan     ? Info or Referral         ? Discharge Planning   Discharge Planning: Home Infusion[facility], Home Health    Update 1345:   SW met with pt at bedside to discuss dispo.   Pt reports that his granddaughter has already contacted [facility].   SW faxed initial referral to facility.    [facility] [ph: [phone]; f: [phone]      ? Medication Needs          Financial         ? Legal         ? Other        Disposition  ? Expected Discharge Date    Expected Discharge Date: 11/28/18  Expected Discharge Time: 1500  ? Transportation   Does the patient need discharge transport arranged?: No  Transportation Name, Phone and Availability #1: [facility])-Ph-[phone]  Does the patient use Medicaid Transportation?: No  ? Next Level of Care (Acute Psych discharges only)      ? Discharge Disposition        Durable Medical Equipment      No service has been selected for the patient.      [facility]      No service has been selected for the patient.      [place]      No service has been selected for the patient.      [facility]      No service has been selected for the patient.        Clemens Catholic, [place], [place] Work  Pager: [phone]  Desk: [phone]

## 2018-11-27 NOTE — Progress Notes
RT Adult Assessment Note    NAME:Tyler Castillo             MRN: [id number]             DOB:11-May-1934          AGE: 84 y.o.  ADMISSION DATE: 11/20/2018             DAYS ADMITTED: LOS: 6 days    RT Treatment Plan:       Protocol Plan: Procedures  Oscillating PEP Therapy: Discontinued    Additional Comments:  Impressions of the patient: NAD  Intervention(s)/outcome(s): using [place] on own now.   Patient education that was completed: none  Recommendations to the care team: none    Vital Signs:  Pulse: 76  RR: 16 PER MINUTE  SpO2: 92 %  O2 Device:    Liter Flow:    O2%:    Breath Sounds: Decreased  Respiratory Effort: Non-Labored

## 2018-11-27 NOTE — Progress Notes
Patient transferring to [id number], report called to [facility]. RN. Family notified of room change.

## 2018-11-28 ENCOUNTER — Encounter: Admit: 2018-11-28 | Discharge: 2018-11-28

## 2018-11-28 ENCOUNTER — Encounter: Admit: 2018-11-30 | Discharge: 2018-11-30 | Admitting: Student in an Organized Health Care Education/Training Program

## 2018-11-28 LAB — CULTURE-ANAEROBIC

## 2018-11-28 LAB — COMPREHENSIVE METABOLIC PANEL: Lab: 136 MMOL/L — ABNORMAL LOW (ref 137–147)

## 2018-11-28 LAB — CBC AND DIFF: Lab: 15 10*3/uL — ABNORMAL HIGH (ref 4.5–11.0)

## 2018-11-28 MED ORDER — ENOXAPARIN 40 MG/0.4 ML SC SYRG
40 mg | Freq: Every day | SUBCUTANEOUS | 0 refills | Status: DC
Start: 2018-11-28 — End: 2018-11-29
  Administered 2018-11-29: 01:00:00 40 mg via SUBCUTANEOUS

## 2018-11-28 MED ORDER — CHLORPROMAZINE 10 MG PO TAB
50 mg | ORAL | 0 refills | Status: DC | PRN
Start: 2018-11-28 — End: 2018-11-28

## 2018-11-28 MED ORDER — MELATONIN 5 MG PO TAB
5 mg | Freq: Every evening | ORAL | 0 refills | Status: DC
Start: 2018-11-28 — End: 2018-12-01
  Administered 2018-11-30 – 2018-12-01 (×2): 5 mg via ORAL

## 2018-11-28 MED ORDER — CHLORPROMAZINE 10 MG PO TAB
25 mg | Freq: Once | ORAL | 0 refills | Status: CP
Start: 2018-11-28 — End: ?
  Administered 2018-11-28: 20:00:00 25 mg via ORAL

## 2018-11-28 MED ORDER — CHLORPROMAZINE 10 MG PO TAB
25 mg | ORAL | 0 refills | Status: DC | PRN
Start: 2018-11-28 — End: 2018-11-29

## 2018-11-28 MED ORDER — CHLORPROMAZINE 10 MG PO TAB
25 mg | Freq: Once | ORAL | 0 refills | Status: CP
Start: 2018-11-28 — End: ?
  Administered 2018-11-28: 16:00:00 25 mg via ORAL

## 2018-11-28 NOTE — Care Plan
Problem: Infection, Risk of  Goal: Absence of infection  Outcome: Goal Ongoing     Problem: Falls, High Risk of  Goal: Absence of falls-Adult Patient  Outcome: Goal Ongoing     Problem: Discharge Planning  Goal: Participation in plan of care  Outcome: Goal Ongoing  Goal: Knowledge regarding plan of care  Outcome: Goal Ongoing  Goal: Prepared for discharge  Outcome: Goal Ongoing     Problem: Pain  Goal: Management of pain  Outcome: Goal Ongoing  Goal: Knowledge of pain management  Outcome: Goal Ongoing  Goal: Progress Toward Pain Management Goals  Outcome: Goal Ongoing     Problem: Mobility/Activity Intolerance  Goal: Maximize functional ADL's and mobility outcomes  Outcome: Goal Ongoing     Problem: Self-Care Deficit  Goal: Maximize ADL functioning  Outcome: Goal Ongoing     Problem: Infection, Risk of, Central Venous Catheter-Associated Bloodstream Infection  Goal: Absence of CVC Associated Bloodstream infection  Outcome: Goal Ongoing

## 2018-11-28 NOTE — Progress Notes
General Progress Note    Name:  Tyler Castillo   [id number] Date:  11/28/2018  Admission Date: 11/20/2018  LOS: 8 days                     Assessment/Plan:    83 y.o.?????????male???past medical history hepatic mass, hyperlipidemia ???who presents with ???leukocytosis, lethargy, increasing hepatic???mass size.  ???  Liver???mass, recurrent  Leukocytosis  Concern for CAP- improving  -history of liver abscess in 2019, status post drainage and 6 weeks ertapenem, 2 weeks Augmentin  -No clear etiology found. Gram stain of perihepatic fluid GPC resembling strep  -Asymptomatic???in interim.?????????1wk lethargy, vague symptoms.??????OSH CT interval increase in size. ???Leukocytosis.  -AST/ALT/ALP???decreasing since admit  -Transferred???from OSH in [place]???for???higher level of care  -On arrival???VSS (slight tachycardia), 2/4 SIRS, pt NAD. ???Benign abdominal exam.  - CT Abd/Pelvis 7/14: 2 new adjacent hepatic abcesses/masses in cephalad liver (larger measuring up to 10cm)  -??????IR???bx for L and R hepatic abscesses 7/16 and 7/17  - CXR 7/15 showed Small right pleural effusion with bibasilar opacities compatible with   atelectasis and/or pneumonia; clinically correlates with productive cough  - 7/16 ERCP with small intrahepatic peripheral bile leak into abscess now s/p sphincterotomy???  - 7/16 EUS antral lesions biopsied for h. Pylori  - left liver abscess fluid grew moderate fusobacterium and right abscess fluid grew light e. Coli???  - patient received picc line 7/19 in preparation for discharge to SNF for IV antibiotics  - Repeat CT 7/20 abdomen without evidence of hepatic mass; showed diverticulosis with development of mild uncomplicated diverticulitis at the junction of descending and sigmoid colon; non-obstructive 1cm righ UVJ and left lower pole renal calculi (consider uro consult); asymmetric hypoenhancement of visualized right superficial and deep femoral veins (recommend [place] to r/o DVT); cholelithiasis  Plan >Ertapenem 1g q24h???first day 7/13; no other antibiotic possibilities  > ID consult: continue ertapenem???(anticipate at least 1 month of antibiotic therapy), monitor drain output  > started RT protocol with IS and flutter, also prescribed???tessalon perles and robitussun???for productive cough  > started protonix 40mg  PO BID for esophagitis per GI recs  > hepatology wanted colonoscopy to evaluate for source of abscess, patient refused; okay per hepatology  > barrier to discharge is antibiotic plan and lab monitoring as well as repeat imaging  > 7/21 added thorazine for hiccups  > surgery reports no acute surgical intervention indicated for cholelithiasis at this time; can follow up outpatient for this issue; surgery also indicated that patient will not likely be a surgical candidate for diverticulitis d/t prior refusal for bowel prep.     Grade D Esophagitis  - 11/23/2018 EUS: Gallbladder stones; Grade D esophagitis; Antral erosions s/p biopsy for H.pylori  - h. Pylori negative  Plan  > Protonix 40mg  PO BID for 3 months until f/u EGD  > consulting surgery for cholelithiasis; f/u recs  ???  Right UPJ Nephrolithiasis  -9mm stone noted in R UPJ  -alternating R and L back pain, described as sharp and above the hip  -pt unaware of h/o stones. ???  - 7/13 UA negative for infection  - per urology following repeat CT 7/20: no acute surgical intervention, keep f/u appointment for Aug 5th  Plan  >???Follow up with urology as outpatient  ???  Chest Pain, resolved  RBBB  -c/o lower chest/upper abdomen bilateral sharp pain in band-like manner   -trops negative EKG RBBB at OSH. No ekg at [place]  - previous EKG showing RBBB, so not  new  - troponin 0.03 7/13  -???EKG???7/13: RBBB, inferior infarct, age indeterminate  - pain was actually more epigastric and resolved with GI cocktail  Plan  - continue to monitor  ???  DVT ppx: heparin held   Fluids: none  Electrolytes: replace prn   Nutrition: regular diet  PT/OT: consulted Dispo: continue inpatient admission  ???  Patient seen & plan discussed with Dr. Orson Ape  ???  Howard Pouch, MD  Anesthesiology, PGY-1  Available on [facility]  Pager [phone]  ________________________________________________________________________    Subjective  Patient feeling better this morning. Hiccups have decreased and patient notes large decrease in hiccups yesterday when walking with PT. Patient expresses concern with pureed diet. States he will have to be on pureed diet permanently. Denies new symptoms.       ROS  Review of Systems   Constitutional: Negative for chills and fever.   Respiratory: Negative for shortness of breath.    Cardiovascular: Negative for chest pain.   Gastrointestinal: Negative for abdominal pain, constipation and diarrhea.   Genitourinary: Negative for dysuria.   Musculoskeletal: Negative for myalgias.   Skin: Negative for rash.   Neurological: Negative for dizziness and headaches.   Psychiatric/Behavioral: Negative for depression.       Medications  Scheduled Meds:alum/mag hydroxide/simeth (MYLANTA) oral suspension 30 mL, 30 mL, Oral, Q4H  ertapenem (INVanz) IVP 1 g, 1 g, Intravenous, Q24H*  lidocaine (LIDODERM) 5 % topical patch 1 patch, 1 patch, Topical, QDAY    And  Verification of Patch Placement and Integrity - Lidocaine 5%, , Transdermal, BID  pantoprazole DR (PROTONIX) tablet 40 mg, 40 mg, Oral, RUE(45-40)  peg-electrolyte solution (NULYTELY) oral solution 4 L, 4 L, Oral, As Prescribed  polyethylene glycol 3350 (MIRALAX) packet 17 g, 1 packet, Oral, QDAY  senna/docusate (SENOKOT-S) tablet 1 tablet, 1 tablet, Oral, BID  sodium chloride PF 0.9% flush 5-10 mL, 5-10 mL, Flush, FLUSH TID  sodium chloride PF 0.9% syringe 10 mL, 10 mL, Intravenous, Q8H*    Continuous Infusions:  PRN and Respiratory Meds:baclofen TID PRN, benzonatate TID PRN, fentaNYL citrate PF Q2H PRN, flumazeniL PRN, guaiFENesin Q4H PRN, naloxone PRN, peg-electrolyte solution PRN (On Call from Rx), phenol PRN, prochlorperazine Q6H PRN, promethazine Q6H PRN, simethicone Q6H PRN        Objective:                          Vital Signs: Last Filed                 Vital Signs: 24 Hour Range   BP: 117/44 (07/21 0600)  Temp: 36.4 ???C (97.6 ???F) (07/21 0600)  Pulse: 66 (07/21 0600)  Respirations: 14 PER MINUTE (07/21 0600)  SpO2: 97 % (07/21 0600)  Height: 170.2 cm (67) (07/20 1514) BP: (117-155)/(43-54)   Temp:  [36.3 ???C (97.3 ???F)-36.7 ???C (98 ???F)]   Pulse:  [61-88]   Respirations:  [14 PER MINUTE-16 PER MINUTE]   SpO2:  [95 %-98 %]      Vitals:    11/20/18 0132 11/27/18 1514   Weight: 83.9 kg (185 lb) 83.9 kg (185 lb)       Intake/Output Summary:  (Last 24 hours)    Intake/Output Summary (Last 24 hours) at 11/28/2018 0847  Last data filed at 11/28/2018 0735  Gross per 24 hour   Intake 545 ml   Output 1270 ml   Net -725 ml      Stool Occurrence: 0  Physical exam:  General: NAD  HEENT: Normocephalic and atraumatic.  Moist Mucous membranes.  Pupils are equal, round and reactive to light. No discharge. No scleral icterus.  Cardiovascular: RRR, no murmurs.   Pulmonary/Chest: Effort normal and breath sounds normal. No respiratory distress. No wheezes or crackles  Abdominal: Soft, nondistended, nontender, and no mass. Bowel sounds are normal. Drain x2 from left anterolateral and posterolateral abdomen draining serosanguinous fluid  Ext: Normal range of motion. No pitting edema in extremities. Nml distal pulses  Lymphadenopathy: No axillary adenopathy. No supraclavicular adenopathy.  Neurological: A&Ox3, no focal findings  Skin: Warm and dry. No rash noted. No erythema. No pallor.  Psychiatric: Normal mood and affect. Behavior is normal. Judgement and thought content normal.     Labs   24-hour labs:    Results for orders placed or performed during the hospital encounter of 11/20/18 (from the past 24 hour(s))   CBC AND DIFF    Collection Time: 11/28/18  4:50 AM   Result Value Ref Range    White Blood Cells 15.8 (H) 4.5 - 11.0 K/UL RBC 2.53 (L) 4.4 - 5.5 M/UL    Hemoglobin 8.4 (L) 13.5 - 16.5 GM/DL    Hematocrit 81.1 (L) 40 - 50 %    MCV 96.4 80 - 100 FL    MCH 33.2 26 - 34 PG    MCHC 34.5 32.0 - 36.0 G/DL    RDW 91.4 11 - 15 %    Platelet Count 385 150 - 400 K/UL    MPV 8.1 7 - 11 FL    Neutrophils 88 (H) 41 - 77 %    Lymphocytes 7 (L) 24 - 44 %    Monocytes 5 4 - 12 %    Eosinophils 0 0 - 5 %    Basophils 0 0 - 2 %    Absolute Neutrophil Count 13.69 (H) 1.8 - 7.0 K/UL    Absolute Lymph Count 1.17 1.0 - 4.8 K/UL    Absolute Monocyte Count 0.85 (H) 0 - 0.80 K/UL    Absolute Eosinophil Count 0.03 0 - 0.45 K/UL    Absolute Basophil Count 0.05 0 - 0.20 K/UL   COMPREHENSIVE METABOLIC PANEL    Collection Time: 11/28/18  4:50 AM   Result Value Ref Range    Sodium 136 (L) 137 - 147 MMOL/L    Potassium 4.2 3.5 - 5.1 MMOL/L    Chloride 104 98 - 110 MMOL/L    Glucose 93 70 - 100 MG/DL    Blood Urea Nitrogen 20 7 - 25 MG/DL    Creatinine 7.82 0.4 - 1.24 MG/DL    Calcium 7.1 (L) 8.5 - 10.6 MG/DL    Total Protein 5.4 (L) 6.0 - 8.0 G/DL    Total Bilirubin 1.8 (H) 0.3 - 1.2 MG/DL    Albumin 2.3 (L) 3.5 - 5.0 G/DL    Alk Phosphatase 956 (H) 25 - 110 U/L    AST (SGOT) 52 (H) 7 - 40 U/L    CO2 25 21 - 30 MMOL/L    ALT (SGPT) 75 (H) 7 - 56 U/L    Anion Gap 7 3 - 12    eGFR Non African American >60 >60 mL/min    eGFR African American >60 >60 mL/min       Imaging Reviewed  CT 7/20  1. Decreased dominant segment 7/8 and unchanged segment 4A multiloculated   fluid collections which remain most compatible with recurrent abscesses.   Interval placement of  percutaneous drainage catheters with development of   internal foci of gas.    2. Diverticulosis with development of mild uncomplicated diverticulitis at   the junction of the descending and sigmoid colon.    3. Nonobstructive 1.0 cm right UVJ and left lower pole renal calculi.   Urology consultation should be considered.    4. Asymmetric hypoenhancement of visualized right superficial and deep femoral veins which is likely flow related, though ultrasound recommended   to exclude deep venous thrombosis.    5. Cholelithiasis.

## 2018-11-28 NOTE — Progress Notes
Acute Inpatient Surgery Update Note    We were called to evaluate patient for cholecystectomy given incidental finding of cholelithiasis. In the setting of him being actively treated for hepatic abscesses and not being symptomatic from the gallbladder, we do not recommend cholecystectomy at this time. If patient becomes symptomatic and if his infection is treated, please re-engage [place], or patient can follow up in clinic by calling [phone].     Peggye Fothergill, DO  General Surgery PGY5

## 2018-11-28 NOTE — Progress Notes
PHYSICAL THERAPY  NOTE      Name: Tyler Castillo        MRN: [id number]          DOB: 02/26/1935          Age: 84 y.o.  Admission Date: 11/20/2018             LOS: 8 days      Patient up to chair during PT attempt; patient initially agreeable however reports feeling drowsy from taking a medication and states "If I get up, I will fall on my face." Patient requested for therapy to follow up at a later time.  Physical therapy will continue to follow and provide intervention as indicated.    Therapist: Eleonore Chiquito  Date: 11/28/2018

## 2018-11-28 NOTE — Care Plan
Problem: Infection, Risk of  Goal: Absence of infection  Outcome: Goal Ongoing  Flowsheets (Taken 11/28/2018 0611)  Absence of infection:   Assess for infection (Monitor SIRS Criteria)   Implement prevention measures as indicated   Monitor for signs and symptoms of infection   Administer pharmacological therapies as ordered     Problem: Falls, High Risk of  Goal: Absence of falls-Adult Patient  Outcome: Goal Ongoing  Flowsheets (Taken 11/28/2018 0611)  Absence of falls-Adult Patient:   Complete Fall Risk Assessment.   Provide fall prevention strategies.   Provide safe ambulation.   Provde safe environment.   Implement fall risk bundle.   Consider additional interventions if patient is confused, has gait/balance problems and on high risk medications.     Problem: Discharge Planning  Goal: Participation in plan of care  Outcome: Goal Ongoing  Flowsheets (Taken 11/28/2018 0611)  Participation in Plan of Care: Involve patient/caregiver in care planning decision making  Goal: Knowledge regarding plan of care  Outcome: Goal Ongoing  Flowsheets (Taken 11/28/2018 0611)  Knowledge regarding plan of care:   Provide admission education to parent/caregiver   Provide fall prevention education   Provide infection prevention education   Provide plan of care education   Provide procedural and treatment education   Provide medication management education  Goal: Prepared for discharge  Outcome: Goal Ongoing  Flowsheets (Taken 11/28/2018 0611)  Prepared for discharge:   Complete ADL ability assessment   Provide safe use medical equipment education   Provide diet and oral health education   Collaborate with multidisciplinary team for hospital discharge coordination     Problem: Pain  Goal: Management of pain  Outcome: Goal Ongoing  Flowsheets (Taken 11/28/2018 0611)  Management of pain:   Complete pain assessment scale according to age, condition and ability to understand.   Assess opioid analgesia side-effects. Assess pain control barriers.   Manage pain.   In the patient who can fully report pain, assess pain characteristics.  Goal: Knowledge of pain management  Outcome: Goal Ongoing  Flowsheets (Taken 11/28/2018 0611)  Knowledge of pain management:   Provide pain scale education   Provide pain management methods education   Provide pharmacological pain management education  Goal: Progress Toward Pain Management Goals  Outcome: Goal Ongoing  Flowsheets (Taken 11/28/2018 0611)  Progress toward pain management goals:   Progress toward pain management goals   Assess progress toward pain management goals     Problem: Mobility/Activity Intolerance  Goal: Maximize functional ADL's and mobility outcomes  Outcome: Goal Ongoing  Flowsheets (Taken 11/28/2018 0611)  Maximize functional ADLs and mobility outcomes:   Administer oxygen to maintain SpO2 at approprate levels   Manage environmental safety   Maintain body position     Problem: Self-Care Deficit  Goal: Maximize ADL functioning  Outcome: Goal Ongoing     Problem: Infection, Risk of, Central Venous Catheter-Associated Bloodstream Infection  Goal: Absence of CVC Associated Bloodstream infection  Outcome: Goal Ongoing  Flowsheets (Taken 11/28/2018 0611)  Absence of CVC associated bloodstream  infection:   Assess for central line catheter infection (Monitor SIRS criteria)   Manage central venous catheter   Follow proper techniques for discontinuation of central line   Provide education on central line home care   Follow central line bundle components   Preparation for central venous catheter insertion

## 2018-11-28 NOTE — Progress Notes
Helene Kelp, pt daughter in law, contacted this RN stating her and her husband would prefer pt go to a facility (instead of home with [facility]). Helene Kelp stated prefers [facility], but unsure if he would qualify for it. If not, they would like recommendations of places.    [phone]    RN to notify CM.

## 2018-11-28 NOTE — Progress Notes
Infectious Disease Progress Note    Today's Date:  11/28/2018  Admission Date: 11/20/2018    Reason for this consultation: Patient with history of hepatic mass of unclear etiology with prior cultures growing GPC resembling strep. Treated with ertapenem. Patient presenting with new similar appearing lesion. Please assist with antibiotic recommendations.    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 No viable tumor cells are present. ???The finding may represent reaction adjacent to mass lesion, or may represent a liver abscess.  -10/26/17 normal liver biopsy: Mild sinusoidal dilation and changes consistent with biliary obstruction. No significant fibrosis.   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 Review of outside CT by IR - felt significantly improved and drain could be removed   -11/21/17 IR drain removal. Abscessogram- no fistula -12/01/17 finished ertapenem and started amox/clav, which he finished ~12/17/17  -12/22/17 another 14-day course of augmentin prescribed  -11/19/18 patient presented to [facility] ED with weakness, malaise, right-sided chest discomfort  -11/19/18 CT abdomen/pelvis: 7.2 x 8.9 cm heterogenous mass within hepatic dome posterior segment of R hepatic lobe; increased in size from prior study (July 19); interval development of 2.9x 3.0cm heterogenous mass within L hepatic lobe; interval development of mild (R) sided hydronephrosis secondary to 9mm calculus of right UPJ  -7/12 CXR: Cardiomegaly, chronic small right pleural effusion, right basilar atelectasis  -Leukocytosis and elevated liver enzymes  -7/13 BC x 2 sets NTD  -7/13 fungal blood cultures x2 sets NTD  -7/14 CT abdomen and pelvis: There is persistent cholelithiasis. The hepatic abscess previously demonstrated in the caudal right lobe adjacent to the gallbladder is not distinctly visualized and has likely resolved. Development of 2 new adjacent hepatic low-density lesions in the cephalad liver. The larger is centered in hepatic segments 7/8 and measures approximately 10 x 7 x 9 cm (axial image 17 and coronal image 87). The adjacent smaller abscess is within hepatic segment 4A and measures approximately 5 x 3.5 x 4.5 cm (axial image 15 and coronal image 50).  -7/15 IR placed a drain in the smaller left-sided hepatic abscess; 20 mL of bloody purulent fluid was aspirated.  No biopsy done and no drain placed in the large right-sided hepatic abscess  -7/15 smaller left-sided hepatic abscess fluid GS: Many PMN, no org; routine culture NTD.  Anaerobic culture + MG Fusobacterium nucleatum  -7/15 fluid cytology: Acute inflammation and debris. Negative for malignant cells.   -7/16 large right-sided hepatic abscess fluid GS: Many PMN, no org; routine culture + E coli.  Anaerobic culture +  LG Fusobacterium nucleatum -7/16 ERCP: Injection of contrast revealed a 5 mm CBD with no distal filling defect suggestive of stone. A guide wire was passed into the intrahepatics and a 7 mm traction biliary sphincterotomy was performed. There was no bleeding. Then a stone extraction balloon was swept multiple times through the duct no stone was extracted. Following this???a balloon occlusion cholangiogram was performed and did showe small intrahepatic ducts with a connection between the right anterior duct with one of the abscess.  -7/20 CT abdomen/pelvis w/: Decreased dominant segment 7/8 and unchanged segment 4A multiloculated fluid collections. Interval placement of percutaneous drainage catheters with development of internal foci of gas. Diverticulosis with development of mild uncomplicated diverticulitis at the junction of the descending and sigmoid colon. Nonobstructive 1.0 cm right UVJ and left lower pole renal calculi. Urology consultation should be considered. Asymmetric hypoenhancement of visualized right superficial and deep femoral veins which is likely flow related, though ultrasound recommended to exclude deep venous thrombosis. Cholelithiasis.    Right hydronephrosis  Right UPJ nephrolithiasis  -11/19/2018 CT abdomen and pelvis: 9mm calculus of right UPJ  -7/14 CT abdomen and pelvis: Right ureteropelvic junction calculus measuring 6 mm with mild upstream pelvocaliectasis without frank hydronephrosis. Adjacent urothelial thickening likely reflects inflammation. Persistent small left lower pole nonobstructing renal calculus.    History of AKI- resolved    History of Viral pericarditis status-post pericardiectomy in 1993    Hyperlipidemia    Former tobacco use (quit 1978)    Recommendations:     1. Continue ertapenem   2. Anticipate at least 1 month of IV antibiotic therapy  3. The CT findings raise the question of liver abscesses complicating diverticulitis.  Consider general surgery evaluation for both diverticulosis/diverticulitis and cholelithiasis; to consider intervention down the road  4. Follow-up cultures   5. Monitor drains' output  6. Monitor temp and WBC count    We'll follow  __________________________________________________________________________________    Subjective/Interval History     Patient remains afebrile  Reports improved hiccups  Sleepy this morning  Denies having shortness of breath  Has occasional cough with whitish phlegm  No nausea, vomiting or diarrhea  Denies having abdominal pain  No dysuria???    WBC 15.8  Hb 8.4  Platelets 385  Creatinine 0.88  AST 52-improved  ALT 75-improved  ALP 504-stable  T bili 1.8-improved    Drains output in the past 24 hours:   #1: 65 mL (flushed with 30 mL)  #2: 50 mL (flushed with 50 mL)    Discussed with Dr. Orson Ape and his team.  ???  Antimicrobial Start date End date   Zosyn   6/17 x 1 dose; 6/19-6/25/19   11/19/2018 x1 dose ???   Ertapenem  6/25-7/25/19; 11/20/2018  active   Augmentin  7/25-8/10; 12/22/17 ???01/05/18   ??? ??? ???   ??? ??? ???   ??? ??? ???   ??? ??? ???   Estimated Creatinine Clearance: 68.2 mL/min (based on SCr of 0.88 mg/dL).    Medications   Scheduled Meds:alum/mag hydroxide/simeth (MYLANTA) oral suspension 30 mL, 30 mL, Oral, Q4H  ertapenem (INVanz) IVP 1 g, 1 g, Intravenous, Q24H*  lidocaine (LIDODERM) 5 % topical patch 1 patch, 1 patch, Topical, QDAY    And  Verification of Patch Placement and Integrity - Lidocaine 5%, , Transdermal, BID  pantoprazole DR (PROTONIX) tablet 40 mg, 40 mg, Oral, ZOX(09-60)  peg-electrolyte solution (NULYTELY) oral solution 4 L, 4 L, Oral, As Prescribed  polyethylene glycol 3350 (MIRALAX) packet  17 g, 1 packet, Oral, QDAY  senna/docusate (SENOKOT-S) tablet 1 tablet, 1 tablet, Oral, BID  sodium chloride PF 0.9% flush 5-10 mL, 5-10 mL, Flush, FLUSH TID  sodium chloride PF 0.9% syringe 10 mL, 10 mL, Intravenous, Q8H*    Continuous Infusions:    PRN and Respiratory Meds:baclofen TID PRN, benzonatate TID PRN, fentaNYL citrate PF Q2H PRN, flumazeniL PRN, guaiFENesin Q4H PRN, naloxone PRN, peg-electrolyte solution PRN (On Call from Rx), phenol PRN, prochlorperazine Q6H PRN, promethazine Q6H PRN, simethicone Q6H PRN      Allergies   No Known Allergies      Physical Examination                          Vital Signs: Last                  Vital Signs: 24 Hour Range   BP: 117/44 (07/21 0600)  Temp: 36.4 ???C (97.6 ???F) (07/21 0600)  Pulse: 66 (07/21 0600)  Respirations: 14 PER MINUTE (07/21 0600)  SpO2: 97 % (07/21 0600)  Height: 170.2 cm (67) (07/20 1514) BP: (117-155)/(43-54)   Temp:  [36.3 ???C (97.3 ???F)-36.7 ???C (98 ???F)]   Pulse:  [61-88]   Respirations:  [14 PER MINUTE-16 PER MINUTE]   SpO2:  [95 %-98 %]      General appearance: Alert, lethargic, in NAD  HENT: No thrush, coated tongue  Lungs: Clear breath sounds anteriorly  Heart: Regular rhythm, reg rate, no murmur, rub, gallop  Abdomen: Soft, non-tender, non-distended, J-Vac drain in the right upper lateral abdomen, J-Vac drain in the right upper quadrant   Ext: No clubbing, cyanosis; mild bilateral lower extremity edema  Skin: No rash    Lines:   R arm PICC line 7/19  PIV      Lab Review   Hematology  Recent Labs     11/26/18  0511 11/27/18  0406 11/28/18  0450   WBC 17.8* 18.2* 15.8*   HGB 10.4* 9.3* 8.4*   HCT 30.7* 27.2* 24.4*   PLTCT 285 325 385     Chemistry  Recent Labs     11/26/18  0511 11/27/18  0406 11/28/18  0450   NA 136* 139 136*   K 4.0 4.4 4.2   CL 101 103 104   CO2 26 26 25    BUN 28* 24 20   CR 0.96 0.92 0.88   GFR >60 >60 >60   GLU 95 91 93   CA 7.4* 7.2* 7.1*   ALBUMIN 2.5* 2.3* 2.3*   ALKPHOS 574* 575* 504*   AST 87* 60* 52*   ALT 123* 89* 75*   TOTBILI 3.2* 2.4* 1.8*       Microbiology, Radiology and other Diagnostics Review   Microbiology data reviewed.    Pertinent radiology images viewed.    7/20 CT abdomen/pelvis w/:  1. Decreased dominant segment 7/8 and unchanged segment 4A multiloculated fluid collections which remain most compatible with recurrent abscesses.   Interval placement of percutaneous drainage catheters with development of   internal foci of gas.  2. Diverticulosis with development of mild uncomplicated diverticulitis at   the junction of the descending and sigmoid colon.  3. Nonobstructive 1.0 cm right UVJ and left lower pole renal calculi.   Urology consultation should be considered.  4. Asymmetric hypoenhancement of visualized right superficial and deep   femoral veins which is likely flow related, though ultrasound recommended   to exclude deep  venous thrombosis.  5. Cholelithiasis.    7/15 CXR:  1. Heart size is mildly enlarged with mild pulmonary vascular congestion.  2. Small right pleural effusion with bibasilar opacities compatible with   atelectasis and/or pneumonia.    7/14 CT abdomen/pelvis:  1. Development of 2 new adjacent hepatic abscess/masses in the cephalad   liver, as described, the larger measuring up to 10 cm. Given prior   clinical history, these likely reflect recurrent abscesses however   underlying hepatic mass such as cholangiocarcinoma could appear similar.  2. Right ureteropelvic junction calculus measuring 6 mm with mild upstream   pelvocaliectasis without frank hydronephrosis. Adjacent urothelial   thickening likely reflects inflammation. Urinalysis is recommended.  3. Persistent small left lower pole nonobstructing renal calculus.  4. Marked sigmoid colon diverticulosis.  5. 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      Theotis Burrow, MD  Division of Infectious Diseases   Pager [phone]

## 2018-11-29 ENCOUNTER — Encounter: Admit: 2018-11-29 | Discharge: 2018-11-29

## 2018-11-29 LAB — COMPREHENSIVE METABOLIC PANEL: Lab: 135 MMOL/L — ABNORMAL LOW (ref 137–147)

## 2018-11-29 LAB — MAGNESIUM: Lab: 2.4 mg/dL — ABNORMAL HIGH (ref >60)

## 2018-11-29 LAB — CBC AND DIFF: Lab: 13 K/UL — ABNORMAL HIGH (ref >60)

## 2018-11-29 LAB — PROTIME INR (PT): Lab: 1.4 M/UL — ABNORMAL HIGH (ref 0.8–1.2)

## 2018-11-29 MED ORDER — SODIUM CHLORIDE 0.9 % IJ SYRG
10 mL | INTRAVENOUS | 0 refills | Status: DC
Start: 2018-11-29 — End: 2018-12-01
  Administered 2018-11-29 – 2018-11-30 (×4): 10 mL via INTRAVENOUS

## 2018-11-29 MED ORDER — CHLORPROMAZINE 10 MG PO TAB
10 mg | Freq: Three times a day (TID) | ORAL | 0 refills | Status: DC
Start: 2018-11-29 — End: 2018-11-30
  Administered 2018-11-29 – 2018-11-30 (×3): 10 mg via ORAL

## 2018-11-29 MED ORDER — POLYETHYLENE GLYCOL 3350 17 GRAM PO PWPK
1 | Freq: Every day | ORAL | 0 refills | Status: DC | PRN
Start: 2018-11-29 — End: 2018-12-01

## 2018-11-29 NOTE — Progress Notes
General Progress Note    Name:  Tyler Castillo   RUEAV'W Date:  11/29/2018  Admission Date: 11/20/2018  LOS: 9 days                     Assessment/Plan:    84 y.o.?????????male???past medical history hepatic mass, hyperlipidemia ???who presents with ???leukocytosis, lethargy, increasing hepatic???mass size.  ???  Liver???mass, recurrent  Leukocytosis  Concern for CAP- improving  -history of liver abscess in 2019, status post drainage and 6 weeks ertapenem, 2 weeks Augmentin  -No clear etiology found. Gram stain of perihepatic fluid GPC resembling strep  -Asymptomatic???in interim.?????????1wk lethargy, vague symptoms.??????OSH CT interval increase in size. ???Leukocytosis.  -AST/ALT/ALP???decreasing since admit  -Transferred???from OSH in [place]???for???higher level of care  -On arrival???VSS (slight tachycardia), 2/4 SIRS, pt NAD. ???Benign abdominal exam.  - CT Abd/Pelvis 7/14: 2 new adjacent hepatic abcesses/masses in cephalad liver (larger measuring up to 10cm)  -??????IR???bx for L and R hepatic abscesses 7/16 and 7/17  - CXR 7/15 showed Small right pleural effusion with bibasilar opacities compatible with   atelectasis and/or pneumonia; clinically correlates with productive cough  - 7/16 ERCP with small intrahepatic peripheral bile leak into abscess now s/p sphincterotomy???  - 7/16 EUS antral lesions biopsied for h. Pylori  - left liver abscess fluid grew moderate fusobacterium and right abscess fluid grew light e. Coli???  - Patient refused bowel prep for colonscopy  - patient received picc line???7/19???in preparation for discharge to SNF for IV antibiotics  - Repeat CT 7/20 abdomen without evidence of hepatic mass; showed diverticulosis with development of mild uncomplicated diverticulitis at the junction of descending and sigmoid colon; non-obstructive 1cm righ UVJ and left lower pole renal calculi (consider uro consult); asymmetric hypoenhancement of visualized right superficial and deep femoral veins (recommend [place] to r/o DVT); cholelithiasis  Plan >Ertapenem 1g q24h???first day 7/13  > ID consult: continue ertapenem???(anticipate at least 1 month of antibiotic therapy), monitor drain output  > RT protocol with IS and flutter, also prescribed???tessalon perles and robitussun???for productive cough  > protonix 40mg  PO BID for esophagitis per GI recs (continue for 3 months until EGD)  > barrier to discharge is antibiotic plan and lab monitoring as well as repeat imaging  > 7/21 added thorazine for hiccups  > surgery reports no acute surgical intervention indicated for cholelithiasis at this time; can follow up outpatient for this issue; surgery also indicated that patient will not likely be a surgical candidate for diverticulitis d/t prior refusal for bowel prep.   > thorazine 10mg  TID to try and treat intractable hiccups; per ID, no way to re-arrange drains to avoid diaphragmatic irritation  ???  Grade D Esophagitis  - 11/23/2018 EUS: Gallbladder stones; Grade D esophagitis; Antral erosions s/p biopsy for H.pylori  - h. Pylori negative  Plan  > Protonix 40mg  PO BID for 3 months until f/u EGD  > consulting surgery for cholelithiasis; f/u recs  ???  Right UPJ Nephrolithiasis  -9mm stone noted in R UPJ  -alternating R and L back pain, described as sharp and above the hip  -pt unaware of h/o stones. ???  - 7/13 UA negative for infection  - per urology following repeat CT 7/20: no acute surgical intervention, keep f/u appointment for Aug 5th  Plan  >???Follow up with urology as outpatient  ???  Chest Pain, resolved  RBBB  -c/o lower chest/upper abdomen bilateral sharp pain in band-like manner   -trops negative EKG RBBB at OSH.  No ekg at [place]  - previous EKG showing RBBB, so not new  - troponin 0.03 7/13  -???EKG???7/13: RBBB, inferior infarct, age indeterminate  - pain was actually more epigastric and resolved with GI cocktail  Plan  - continue to monitor    Normocytic Anemia  - patient admitted with Hgb of 12.4; has been decreasing steadily ever since to 8.2 on 7/22 - 7/16 EUS with grade D esophagitis  - patient refused bowel prep for colonoscopy; last colonoscopy in 2017 normal  - CT abd pelvis 7/20 without evidence of retroperitoneal bleed or frank bleed otherwise  Plan  > CTM, source of anemia unknown    ???  DVT ppx: lovenox  Fluids: none  Electrolytes: replace prn   Nutrition: regular diet  PT/OT: consulted   Dispo: continue inpatient admission  ???  Patient seen & plan discussed with Dr.???Tally Due, MD  Anesthesiology, PGY-1  Available on [facility] [phone]  ________________________________________________________________________    Subjective  Patient reports feeling well today aside from hiccups. He said that the thorazine yesterday made him feel loopy and sleepy and that it did not greatly help with his hiccups.      ROS  Review of Systems   Constitutional: Negative for chills and fever.   Respiratory: Negative for shortness of breath.    Cardiovascular: Negative for chest pain.   Gastrointestinal: Negative for abdominal pain, blood in stool, constipation, diarrhea and melena.   Genitourinary: Negative for dysuria.   Musculoskeletal: Negative for myalgias.   Skin: Negative for rash.   Neurological: Negative for dizziness and headaches.   Psychiatric/Behavioral: Negative for depression.       Medications  Scheduled Meds:alum/mag hydroxide/simeth (MYLANTA) oral suspension 30 mL, 30 mL, Oral, Q4H  enoxaparin (LOVENOX) syringe 40 mg, 40 mg, Subcutaneous, QDAY(21)  ertapenem (INVanz) IVP 1 g, 1 g, Intravenous, Q24H*  lidocaine (LIDODERM) 5 % topical patch 1 patch, 1 patch, Topical, QDAY    And  Verification of Patch Placement and Integrity - Lidocaine 5%, , Transdermal, BID  melatonin tablet 5 mg, 5 mg, Oral, QHS  pantoprazole DR (PROTONIX) tablet 40 mg, 40 mg, Oral, BID(11-21)  senna/docusate (SENOKOT-S) tablet 1 tablet, 1 tablet, Oral, BID  sodium chloride PF 0.9% flush 5-10 mL, 5-10 mL, Flush, FLUSH TID sodium chloride PF 0.9% syringe 10 mL, 10 mL, Intravenous, Q8H*    Continuous Infusions:  PRN and Respiratory Meds:benzonatate TID PRN, chlorproMAZINE Q6H PRN, polyethylene glycol 3350 QDAY PRN, simethicone Q6H PRN        Objective:                          Vital Signs: Last Filed                 Vital Signs: 24 Hour Range   BP: 126/46 (07/22 0742)  Temp: 36.5 ???C (97.7 ???F) (07/22 4782)  Pulse: 62 (07/22 0742)  Respirations: 18 PER MINUTE (07/22 0742)  SpO2: 97 % (07/22 0742) BP: (118-154)/(41-62)   Temp:  [36.3 ???C (97.4 ???F)-37.1 ???C (98.7 ???F)]   Pulse:  [62-94]   Respirations:  [16 PER MINUTE-20 PER MINUTE]   SpO2:  [95 %-99 %]    Intensity Pain Scale (Self Report): 3 (11/28/18 1945) Vitals:    11/20/18 0132 11/27/18 1514   Weight: 83.9 kg (185 lb) 83.9 kg (185 lb)       Intake/Output Summary:  (Last 24 hours)    Intake/Output  Summary (Last 24 hours) at 11/29/2018 0830  Last data filed at 11/29/2018 0800  Gross per 24 hour   Intake 800 ml   Output 655 ml   Net 145 ml      Stool Occurrence: 1      Physical exam:  General: NAD, constant hiccuping  HEENT: Normocephalic and atraumatic.  Moist Mucous membranes. No discharge. No scleral icterus.  Cardiovascular: RRR, no murmurs.   Pulmonary/Chest: Effort normal and breath sounds normal. No respiratory distress. No wheezes or crackles.  Abdominal: Soft, nondistended, nontender, and no mass. Bowel sounds are normal. Drains x2 from left abdomen.  Ext: Normal range of motion. No pitting edema in extremities. Nml distal pulses  Neurological: A&Ox3, no focal findings  Skin: Warm and dry. No rash noted. No erythema. No pallor.  Psychiatric: Normal mood and affect. Behavior is normal. Judgement and thought content normal.     Labs   24-hour labs:    Results for orders placed or performed during the hospital encounter of 11/20/18 (from the past 24 hour(s))   CBC AND DIFF    Collection Time: 11/29/18  3:10 AM   Result Value Ref Range    White Blood Cells 13.0 (H) 4.5 - 11.0 K/UL RBC 2.43 (L) 4.4 - 5.5 M/UL    Hemoglobin 8.2 (L) 13.5 - 16.5 GM/DL    Hematocrit 62.9 (L) 40 - 50 %    MCV 95.5 80 - 100 FL    MCH 33.9 26 - 34 PG    MCHC 35.4 32.0 - 36.0 G/DL    RDW 52.8 11 - 15 %    Platelet Count 397 150 - 400 K/UL    MPV 8.0 7 - 11 FL    Neutrophils 84 (H) 41 - 77 %    Lymphocytes 9 (L) 24 - 44 %    Monocytes 6 4 - 12 %    Eosinophils 1 0 - 5 %    Basophils 0 0 - 2 %    Absolute Neutrophil Count 10.86 (H) 1.8 - 7.0 K/UL    Absolute Lymph Count 1.18 1.0 - 4.8 K/UL    Absolute Monocyte Count 0.81 (H) 0 - 0.80 K/UL    Absolute Eosinophil Count 0.06 0 - 0.45 K/UL    Absolute Basophil Count 0.06 0 - 0.20 K/UL   COMPREHENSIVE METABOLIC PANEL    Collection Time: 11/29/18  3:10 AM   Result Value Ref Range    Sodium 135 (L) 137 - 147 MMOL/L    Potassium 4.2 3.5 - 5.1 MMOL/L    Chloride 103 98 - 110 MMOL/L    Glucose 107 (H) 70 - 100 MG/DL    Blood Urea Nitrogen 20 7 - 25 MG/DL    Creatinine 4.13 0.4 - 1.24 MG/DL    Calcium 7.1 (L) 8.5 - 10.6 MG/DL    Total Protein 5.6 (L) 6.0 - 8.0 G/DL    Total Bilirubin 1.5 (H) 0.3 - 1.2 MG/DL    Albumin 2.3 (L) 3.5 - 5.0 G/DL    Alk Phosphatase 244 (H) 25 - 110 U/L    AST (SGOT) 46 (H) 7 - 40 U/L    CO2 26 21 - 30 MMOL/L    ALT (SGPT) 63 (H) 7 - 56 U/L    Anion Gap 6 3 - 12    eGFR Non African American >60 >60 mL/min    eGFR African American >60 >60 mL/min   MAGNESIUM    Collection Time: 11/29/18  3:10  AM   Result Value Ref Range    Magnesium 2.4 1.6 - 2.6 mg/dL   PROTIME INR (PT)    Collection Time: 11/29/18  3:10 AM   Result Value Ref Range    INR 1.4 (H) 0.8 - 1.2       Imaging Reviewed  none

## 2018-11-29 NOTE — Progress Notes
Infectious Disease Progress Note    Today's Date:  11/29/2018  Admission Date: 11/20/2018    Reason for this consultation: Patient with history of hepatic mass of unclear etiology with prior cultures growing GPC resembling strep. Treated with ertapenem. Patient presenting with new similar appearing lesion. Please assist with antibiotic recommendations.    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 No viable tumor cells are present. ???The finding may represent reaction adjacent to mass lesion, or may represent a liver abscess.  -10/26/17 normal liver biopsy: Mild sinusoidal dilation and changes consistent with biliary obstruction. No significant fibrosis.   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 Review of outside CT by IR - felt significantly improved and drain could be removed   -11/21/17 IR drain removal. Abscessogram- no fistula -12/01/17 finished ertapenem and started amox/clav, which he finished ~12/17/17  -12/22/17 another 14-day course of augmentin prescribed  -11/19/18 patient presented to [facility] ED with weakness, malaise, right-sided chest discomfort  -11/19/18 CT abdomen/pelvis: 7.2 x 8.9 cm heterogenous mass within hepatic dome posterior segment of R hepatic lobe; increased in size from prior study (July 19); interval development of 2.9x 3.0cm heterogenous mass within L hepatic lobe; interval development of mild (R) sided hydronephrosis secondary to 9mm calculus of right UPJ  -7/12 CXR: Cardiomegaly, chronic small right pleural effusion, right basilar atelectasis  -Leukocytosis and elevated liver enzymes  -7/13 BC x 2 sets NTD  -7/13 fungal blood cultures x2 sets NTD  -7/14 CT abdomen and pelvis: There is persistent cholelithiasis. The hepatic abscess previously demonstrated in the caudal right lobe adjacent to the gallbladder is not distinctly visualized and has likely resolved. Development of 2 new adjacent hepatic low-density lesions in the cephalad liver. The larger is centered in hepatic segments 7/8 and measures approximately 10 x 7 x 9 cm (axial image 17 and coronal image 87). The adjacent smaller abscess is within hepatic segment 4A and measures approximately 5 x 3.5 x 4.5 cm (axial image 15 and coronal image 50).  -7/15 IR placed a drain in the smaller left-sided hepatic abscess; 20 mL of bloody purulent fluid was aspirated.  No biopsy done and no drain placed in the large right-sided hepatic abscess  -7/15 smaller left-sided hepatic abscess fluid GS: Many PMN, no org; routine culture NTD.  Anaerobic culture + MG Fusobacterium nucleatum  -7/15 fluid cytology: Acute inflammation and debris. Negative for malignant cells.   -7/16 large right-sided hepatic abscess fluid GS: Many PMN, no org; routine culture + E coli.  Anaerobic culture +  LG Fusobacterium nucleatum -7/16 ERCP: Injection of contrast revealed a 5 mm CBD with no distal filling defect suggestive of stone. A guide wire was passed into the intrahepatics and a 7 mm traction biliary sphincterotomy was performed. There was no bleeding. Then a stone extraction balloon was swept multiple times through the duct no stone was extracted. Following this???a balloon occlusion cholangiogram was performed and did showe small intrahepatic ducts with a connection between the right anterior duct with one of the abscess.  -7/20 CT abdomen/pelvis w/: Decreased dominant segment 7/8 and unchanged segment 4A multiloculated fluid collections. Interval placement of percutaneous drainage catheters with development of internal foci of gas. Diverticulosis with development of mild uncomplicated diverticulitis at the junction of the descending and sigmoid colon. Nonobstructive 1.0 cm right UVJ and left lower pole renal calculi. Urology consultation should be considered. Asymmetric hypoenhancement of visualized right superficial and deep femoral veins which is likely flow related, though ultrasound recommended to exclude deep venous thrombosis. Cholelithiasis.    Right hydronephrosis  Right UPJ nephrolithiasis  -11/19/2018 CT abdomen and pelvis: 9mm calculus of right UPJ  -7/14 CT abdomen and pelvis: Right ureteropelvic junction calculus measuring 6 mm with mild upstream pelvocaliectasis without frank hydronephrosis. Adjacent urothelial thickening likely reflects inflammation. Persistent small left lower pole nonobstructing renal calculus.    History of AKI- resolved    History of Viral pericarditis status-post pericardiectomy in 1993    Hyperlipidemia    Former tobacco use (quit 1978)    Recommendations:     1. Continue ertapenem   2. Anticipate at least 1 month of IV antibiotic therapy  3. Follow-up cultures   4. Monitor drains' output  5. Monitor temp and WBC count    We'll follow __________________________________________________________________________________    Subjective/Interval History     Patient remains afebrile  Feeling better  Hiccups almost resolved  No shortness of breath  Has occasional cough with whitish phlegm  Had nausea last night  No vomiting or diarrhea  No abdominal pain  No dysuria???    WBC 13  Hb 8.2  Platelets 397  Creatinine 0.91  AST 46 -improved  ALT 63 -improved  ALP 477-improved   T bili 1.5-improved    Drains output in the past 24 hours:   #1:  0 mL (flushed with 30 mL)  #2:  20 mL (flushed with 30 mL)    Discussed with Dr. Orson Ape.  ???  Antimicrobial Start date End date   Zosyn   6/17 x 1 dose; 6/19-6/25/19   11/19/2018 x1 dose ???   Ertapenem  6/25-7/25/19; 11/20/2018  active   Augmentin  7/25-8/10; 12/22/17 ???01/05/18   ??? ??? ???   ??? ??? ???   ??? ??? ???   ??? ??? ???   Estimated Creatinine Clearance: 65.9 mL/min (based on SCr of 0.91 mg/dL).    Medications   Scheduled Meds:alum/mag hydroxide/simeth (MYLANTA) oral suspension 30 mL, 30 mL, Oral, Q4H  enoxaparin (LOVENOX) syringe 40 mg, 40 mg, Subcutaneous, QDAY(21)  ertapenem (INVanz) IVP 1 g, 1 g, Intravenous, Q24H*  lidocaine (LIDODERM) 5 % topical patch 1 patch, 1 patch, Topical, QDAY    And  Verification of Patch Placement and Integrity - Lidocaine 5%, , Transdermal, BID  melatonin tablet 5 mg, 5 mg, Oral, QHS  pantoprazole DR (PROTONIX) tablet 40 mg, 40 mg, Oral, BID(11-21)  senna/docusate (SENOKOT-S) tablet 1 tablet, 1 tablet, Oral, BID  sodium chloride PF 0.9% flush 5-10 mL, 5-10 mL, Flush, FLUSH TID  sodium chloride PF  0.9% syringe 10 mL, 10 mL, Intravenous, Q8H*    Continuous Infusions:    PRN and Respiratory Meds:benzonatate TID PRN, chlorproMAZINE Q6H PRN, polyethylene glycol 3350 QDAY PRN, simethicone Q6H PRN      Allergies   No Known Allergies      Physical Examination                          Vital Signs: Last                  Vital Signs: 24 Hour Range   BP: 126/46 (07/22 0742)  Temp: 36.5 ???C (97.7 ???F) (07/22 1610) Pulse: 62 (07/22 0742)  Respirations: 18 PER MINUTE (07/22 0742)  SpO2: 97 % (07/22 0742) BP: (118-154)/(41-62)   Temp:  [36.3 ???C (97.4 ???F)-37.1 ???C (98.7 ???F)]   Pulse:  [62-94]   Respirations:  [16 PER MINUTE-20 PER MINUTE]   SpO2:  [95 %-99 %]      General appearance: Alert, lethargic, in NAD  HENT: No thrush, coated tongue  Lungs: Clear breath sounds anteriorly  Heart: Regular rhythm, reg rate, no murmur, rub, gallop  Abdomen: Soft, non-tender, non-distended, J-Vac drain in the right upper lateral abdomen, J-Vac drain in the right upper quadrant   Ext: No clubbing, cyanosis; mild bilateral lower extremity edema  Skin: No rash    Lines:   R arm PICC line 7/19  PIV      Lab Review   Hematology  Recent Labs     11/27/18  0406 11/28/18  0450 11/29/18  0310   WBC 18.2* 15.8* 13.0*   HGB 9.3* 8.4* 8.2*   HCT 27.2* 24.4* 23.3*   PLTCT 325 385 397   INR  --   --  1.4*     Chemistry  Recent Labs     11/27/18  0406 11/28/18  0450 11/29/18  0310   NA 139 136* 135*   K 4.4 4.2 4.2   CL 103 104 103   CO2 26 25 26    BUN 24 20 20    CR 0.92 0.88 0.91   GFR >60 >60 >60   GLU 91 93 107*   CA 7.2* 7.1* 7.1*   ALBUMIN 2.3* 2.3* 2.3*   ALKPHOS 575* 504* 477*   AST 60* 52* 46*   ALT 89* 75* 63*   TOTBILI 2.4* 1.8* 1.5*       Microbiology, Radiology and other Diagnostics Review   Microbiology data reviewed.    Pertinent radiology images viewed.    7/21 RLE US/Doppler:  No right lower extremity deep venous thrombosis    7/20 CT abdomen/pelvis w/:  1. Decreased dominant segment 7/8 and unchanged segment 4A multiloculated   fluid collections which remain most compatible with recurrent abscesses.   Interval placement of percutaneous drainage catheters with development of   internal foci of gas.  2. Diverticulosis with development of mild uncomplicated diverticulitis at   the junction of the descending and sigmoid colon.  3. Nonobstructive 1.0 cm right UVJ and left lower pole renal calculi.   Urology consultation should be considered. 4. Asymmetric hypoenhancement of visualized right superficial and deep   femoral veins which is likely flow related, though ultrasound recommended   to exclude deep venous thrombosis.  5. Cholelithiasis.    7/15 CXR:  1. Heart size is mildly enlarged with mild pulmonary vascular congestion.  2. Small right pleural effusion with bibasilar opacities compatible with   atelectasis and/or pneumonia.  7/14 CT abdomen/pelvis:  1. Development of 2 new adjacent hepatic abscess/masses in the cephalad   liver, as described, the larger measuring up to 10 cm. Given prior   clinical history, these likely reflect recurrent abscesses however   underlying hepatic mass such as cholangiocarcinoma could appear similar.  2. Right ureteropelvic junction calculus measuring 6 mm with mild upstream   pelvocaliectasis without frank hydronephrosis. Adjacent urothelial   thickening likely reflects inflammation. Urinalysis is recommended.  3. Persistent small left lower pole nonobstructing renal calculus.  4. Marked sigmoid colon diverticulosis.  5. 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      Theotis Burrow, MD  Division of Infectious Diseases   Pager [phone]

## 2018-11-29 NOTE — Progress Notes
PHYSICAL THERAPY  PROGRESS NOTE        Name: Tyler Castillo        MRN: [id number]          DOB: Sep 08, 1934          Age: 84 y.o.  Admission Date: 11/20/2018             LOS: 9 days        Mobility  Progressive Mobility Level: Walk in hallway  Distance Walked (feet): (90+60)  Level of Assistance: Assist X2  Assistive Device: Walker  Time Tolerated: 11-30 minutes  Activity Limited By: Weakness;Fatigue    Subjective  Significant hospital events: Admitted for recurrent liver mass (drain placed 7/15), R nephrolithiasis, and weakness/malaise.  Recent admission to OSH for dehydration (7/9-7/9).  PMH:  PMH, liver mass (10/2017).  Mental / Cognitive Status: Alert;Oriented;Cooperative  Persons Present: Nursing Staff  Pain: Patient has no complaint of pain  Pain Interventions: Patient assisted into position of comfort;Patient agrees to participate in therapy  Comments: Assisted fall in AM.   Ambulation Assist: Independent Mobility in Community without Device  Patient Owned Equipment: None  Home Situation: Lives Alone  Type of Home: House  Entry Stairs: 1-2 Stairs;Rail on Both Sides  In-Home Stairs: Able to Live on One Level  Comments: Reports fall 2-3 wks. ago but no other incidents in the last year.  Son/DIL nearby and assist with meal prep and community mobility, as needed.       Bed Mobility/Transfer  Comments: Patient up to chair upon arrival.   Transfer Type: Sit to/from Stand  Transfer: Assistance Level: To/From;Bed Side Chair;Moderate Assist  Transfer: Assistive Device: Nurse, adult  Transfers: Type Of Assistance: For Balance;For Strength Deficit;Verbal Cues  End Of Activity Status: Up in Chair;Nursing Notified;Instructed Patient to Request Assist with Mobility;Instructed Patient to Use Call Light       Gait  Gait Distance: (90+52ft with seated rest)  Gait: Assistance Level: Minimal Assist(2nd person for chair follow)  Gait: Assistive Device: Roller Walker Gait: Descriptors: Decreased foot clearance RLE;Decreased foot clearance LLE;Decreased heel strike RLE;Decreased heel strike LLE;Forward trunk flexion;Pace: Slow;Decreased step length;Decreased knee extension in stance phase RLE;Decreased knee extension in stance phase LLE  Comments: Progressive flexed posture to walker. Need for seated rest break due to fatigue - no incidents of buckling however noted LE weakness with prolonged activity.   Activity Limited By: Weakness;Complaint of Fatigue       Education  Persons Educated: Patient  Patient Barriers To Learning: None Noted  Interventions: Repetition of Instructions  Teaching Methods: Verbal Instruction  Patient Response: Verbalized Understanding;More Instruction Required  Topics: Plan/Goals of PT Interventions;Use of Assistive Device/Orthosis;Mobility Progression;Safety Awareness;Up with Assist Only;Importance of Increasing Activity;Positioning;Recommend Continued Therapy    Assessment/Progress  Impaired Mobility Due To: Decreased Strength;Impaired Balance;Deconditioning  Assessment/Progress: Should Improve w/ Continued PT  Comments: Patient needing increased assistance this date with all mobility - 2nd person for safety due to recent fall. Due to proximal weakness, patient is at high risk for falls - continue to recommend chair follow with 2nd person.    AM-PAC 6 Clicks Basic Mobility Inpatient  Turning from your back to your side while in a flat bed without using bed rails: A Little  Moving from lying on your back to sitting on the side of a flatbed without using bedrails : A Little  Moving to and from a bed to a chair (including a wheelchair): A Little  Standing up from a chair using your  arms (e.g. wheelchair, or bedside chair): A Lot  To walk in hospital room: A Lot  Climbing 3-5 steps with a railing: Total  Raw Score: 14  Standardized (T-scale) Score: 35.55  Basic Mobility CMS 0-100%: 53.86  CMS G Code Modifier for Basic Mobility: CK    Goals Goal Formulation: With Patient  Time For Goal Achievement: 7 days  Patient Will Go Supine To/From Sit: w/ Stand By Assist, Ongoing  Patient Will Transfer Sit to Stand: w/ Stand By Assist, Ongoing  Patient Will Ambulate: Greater than 200 Feet, w/ Stand By Assist, Ongoing(least restrictive device)  Patient Will Go Up / Down Stairs: 1-2 Stairs, w/ Stand By Assist, Ongoing    Plan  Treatment Interventions: Mobility Training;Strengthening;Balance Activities  Plan Frequency: 5 Days per Week  PT Plan for Next Visit: Progress gait. Sit to stand repetitions.   ???  PT Discharge Recommendations  Recommendation: Inpatient setting       Therapist: Particia Jasper  Date: 11/29/2018

## 2018-11-29 NOTE — Care Plan
Problem: Infection, Risk of  Goal: Absence of infection  Outcome: Goal Ongoing  Flowsheets (Taken 11/28/2018 0611 by Darnelle Spangle, RN)  Absence of infection:   Assess for infection (Monitor SIRS Criteria)   Implement prevention measures as indicated   Monitor for signs and symptoms of infection   Administer pharmacological therapies as ordered     Problem: Falls, High Risk of  Goal: Absence of falls-Adult Patient  Outcome: Goal Ongoing  Flowsheets (Taken 11/28/2018 0611 by Darnelle Spangle, RN)  Absence of falls-Adult Patient:   Complete Fall Risk Assessment.   Provide fall prevention strategies.   Provide safe ambulation.   Provde safe environment.   Implement fall risk bundle.   Consider additional interventions if patient is confused, has gait/balance problems and on high risk medications.     Problem: Discharge Planning  Goal: Participation in plan of care  Outcome: Goal Ongoing  Goal: Knowledge regarding plan of care  Outcome: Goal Ongoing  Flowsheets (Taken 11/28/2018 2136)  Knowledge regarding plan of care:   Provide procedural and treatment education   Provide infection prevention education   Provide VTE signs and symptoms education   Provide plan of care education   Provide fall prevention education   Provide medication management education  Goal: Prepared for discharge  Outcome: Goal Ongoing     Problem: Pain  Goal: Management of pain  Outcome: Goal Ongoing  Goal: Knowledge of pain management  Outcome: Goal Ongoing  Goal: Progress Toward Pain Management Goals  Outcome: Goal Ongoing     Problem: Mobility/Activity Intolerance  Goal: Maximize functional ADL's and mobility outcomes  Outcome: Goal Ongoing     Problem: Self-Care Deficit  Goal: Maximize ADL functioning  Outcome: Goal Ongoing     Problem: Infection, Risk of, Central Venous Catheter-Associated Bloodstream Infection  Goal: Absence of CVC Associated Bloodstream infection  Outcome: Goal Ongoing  Flowsheets (Taken 11/28/2018 2136) Absence of CVC associated bloodstream  infection:   Assess for central line catheter infection (Monitor SIRS criteria)   Manage central venous catheter   Follow central line bundle components

## 2018-11-29 NOTE — Progress Notes
OCCUPATIONAL THERAPY  PROGRESS NOTE      Name: Tyler Castillo        MRN: [id number]          DOB: 10-Mar-1935          Age: 84 y.o.  Admission Date: 11/20/2018             LOS: 9 days      Mobility  Progressive Mobility Level: Walk in room  Distance Walked (feet): 20 ft(+20)  Level of Assistance: Assist X1  Assistive Device: Walker  Time Tolerated: 11-30 minutes  Activity Limited By: Shortness of air;Fatigue    Subjective  Pertinent Dx per Physician: PMH-hepatic mass, hyperlipidemia  who presents with  leukocytosis, lethargy, increasing hepatic mass size. Found to have abcess. Right neph tube placed 7/15 and left to be placed 7/16.  Precautions: Falls  Pain / Complaints: Patient agrees to participate in therapy    Objective  Psychosocial Status: Willing and Cooperative to Participate  Persons Present: Nursing Staff    Home Living  Type of Home: House    Prior Function  Level Of Independence: Independent with ADLs and functional transfers;Independent with homemaking w/ ambulation  Lives With: Alone  Receives Help From: None Needed    ADL's  Where Assessed: In Bathroom  Toileting Assist: Minimal Assist  Toileting Deficits: Steadying;Increased Time To Complete  Comment: Pt attempts to urinate standing at toilet with walker and minimal assist however does not have bladder occurence.    ADL Mobility  Bed Mobility: Supine to Sit: Standby assist  Bed Mobility Comments: increased time required  Transfer Type: Sit to stand  Transfer: Assistance Level: From;Bed;Minimal assist  Transfer: Assistive Device: Roller walker  Transfer: Type of Assistance: For safety considerations  End of Activity Status: Up in chair;Nursing notified;Instructed patient to use call light;Instructed patient to request assist with mobility  Sitting Balance: Static sitting balance;1 UE support  Standing Balance: Static standing balance;Minimal assist  Gait Distance: 20 feet(+20)  Gait: Assistance Level: Minimal assist Gait: Assistive Device: Roller walker  Gait Comments: Pt ambulates to bathroom with roller walker and minimal assist to stand at toilet. Pt exiting bathroom to ambulate to chair with minimal assist. Pt near recliner with BLE became weak, pt unable to regain standing position and therapist assisted pt to dafe sitting position on floor with use of gait belt. RN staff enters room to assist with transfer from floor to recliner.     Activity Tolerance  Endurance: 2/5 Tolerates 10-20 Minutes Exercise w/Multiple Rests    Assessment  Assessment: Decreased ADL Status;Decreased Endurance;Decreased Self-Care Trans;Decreased High-Level ADLs  Prognosis: Good;w/Cont OT s/p Acute Discharge  Goal Formulation: Patient    AM-PAC 6 Clicks Daily Activity Inpatient  Putting on and taking off regular lower body clothes?: A Lot  Bathing (Including washing, rinsing, drying): A Lot  Toileting, which includes using toilet, bedpan, or urinal: A Little  Putting on and taking off regular upper body clothing: None  Taking care of personal grooming such as brushing teeth: A Little  Eating meals?: None  Daily Activity Raw Score: 18  Standardized (t-scale) score: 38.66  CMS 0-100% Score: 46.65  CMS G Code Modifier: CK    Plan  OT Frequency: 3-5x/week  OT Plan for Next Visit: progress standing tolerance with chair follow at sink for ADLs    ADL Goals  Patient Will Perform Grooming: Standing at Sink;w/ Minimum Assist  Patient Will Perform Toileting: w/ Bedside Commode;w/ Moderate Assist    Functional  Transfer Goals  Pt Will Perform All Functional Transfers: Minimum Assist    OT Discharge Recommendations  Recommendation: Inpatient setting  Patient Currently Requires Physical Assist With: All mobility;All personal care ADLs;All home functioning ADLs    Therapist: Dineen Kid, OTR/L [id number]  Date: 11/29/2018

## 2018-11-30 ENCOUNTER — Encounter: Admit: 2018-11-30 | Discharge: 2018-11-30

## 2018-11-30 DIAGNOSIS — R160 Hepatomegaly, not elsewhere classified: Secondary | ICD-10-CM

## 2018-11-30 LAB — CBC AND DIFF: Lab: 12 10*3/uL — ABNORMAL HIGH (ref >60)

## 2018-11-30 LAB — COMPREHENSIVE METABOLIC PANEL: Lab: 134 MMOL/L — ABNORMAL LOW (ref >60)

## 2018-11-30 MED ORDER — MIDAZOLAM 1 MG/ML IJ SOLN
1 mg | Freq: Once | INTRAVENOUS | 0 refills | Status: CP
Start: 2018-11-30 — End: ?
  Administered 2018-11-30: 14:00:00 1 mg via INTRAVENOUS

## 2018-11-30 MED ORDER — CHLORPROMAZINE 10 MG PO TAB
10 mg | Freq: Three times a day (TID) | ORAL | 0 refills | Status: DC | PRN
Start: 2018-11-30 — End: 2018-12-01
  Administered 2018-12-01: 14:00:00 10 mg via ORAL

## 2018-11-30 MED ORDER — FENTANYL CITRATE (PF) 50 MCG/ML IJ SOLN
0 refills | Status: CP
Start: 2018-11-30 — End: ?
  Administered 2018-11-30 (×2): 50 ug via INTRAVENOUS

## 2018-11-30 MED ORDER — MIDAZOLAM 1 MG/ML IJ SOLN
0 refills | Status: CP
Start: 2018-11-30 — End: ?
  Administered 2018-11-30: 15:00:00 1 mg via INTRAVENOUS

## 2018-11-30 MED ORDER — SODIUM CHLORIDE 0.9 % IJ SYRG
10 mL | INTRAVENOUS | 0 refills | Status: DC
Start: 2018-11-30 — End: 2018-12-01
  Administered 2018-11-30 – 2018-12-01 (×2): 10 mL via INTRAVENOUS

## 2018-11-30 NOTE — Progress Notes
General Progress Note    Name:  Tyler Castillo   [id number] Date:  11/30/2018  Admission Date: 11/20/2018  LOS: 10 days                     Assessment/Plan:    Assessment/Plan:    84 y.o.?????????male???past medical history hepatic mass, hyperlipidemia ???who presents with ???leukocytosis, lethargy, increasing hepatic???mass size.  ???  Liver???mass, recurrent  Leukocytosis  Concern for CAP- improving  -history of liver abscess in 2019, status post drainage and 6 weeks ertapenem, 2 weeks Augmentin  -No clear etiology found. Gram stain of perihepatic fluid GPC resembling strep  -Asymptomatic???in interim.?????????1wk lethargy, vague symptoms.??????OSH CT interval increase in size. ???Leukocytosis.  -AST/ALT/ALP???decreasing since admit  -Transferred???from OSH in [place]???for???higher level of care  -On arrival???VSS (slight tachycardia), 2/4 SIRS, pt NAD. ???Benign abdominal exam.  - CT Abd/Pelvis 7/14: 2 new adjacent hepatic abcesses/masses in cephalad liver (larger measuring up to 10cm)  -??????IR???bx for L and R hepatic abscesses 7/16 and 7/17  - CXR 7/15 showed Small right pleural effusion with bibasilar opacities compatible with   atelectasis and/or pneumonia; clinically correlates with productive cough  - 7/16 ERCP with small intrahepatic peripheral bile leak into abscess now s/p sphincterotomy???  - 7/16 EUS antral lesions biopsied for h. Pylori  - left liver abscess fluid grew moderate fusobacterium and right abscess fluid grew light e. Coli???  - Patient refused bowel prep for colonscopy  - patient received picc line???7/19???in preparation for discharge to SNF for IV antibiotics  - Repeat CT???7/20???abdomen without evidence of hepatic mass;???showed diverticulosis with development of mild uncomplicated diverticulitis at the junction of descending and sigmoid colon; non-obstructive 1cm righ UVJ and left lower pole renal calculi (consider uro consult); asymmetric hypoenhancement of visualized right superficial and deep femoral veins (recommend [place] to r/o DVT); cholelithiasis  - surgery???reports no acute surgical intervention indicated for cholelithiasis at this time; can follow up outpatient for this issue; surgery also indicated that patient will not likely be a surgical candidate for diverticulitis d/t prior refusal for bowel prep.???  Plan  >Ertapenem 1g q24h???first day 7/13  > RT protocol with IS and flutter, also prescribed???tessalon perles and robitussun???for productive cough  > protonix 40mg  PO BID for esophagitis per GI recs (continue for 3 months until EGD)  > barrier to discharge is antibiotic plan and lab monitoring as well as repeat imaging  > thorazine 10mg  TID to try and treat intractable hiccups  > to IR today for drain maneuvering to decrease diaphragmatic irritation and hopefully reduce hiccups; will CTM throughout the day with plan for d/c tomorrow  ???  Grade D Esophagitis  - 11/23/2018 EUS:???Gallbladder stones;???Grade D esophagitis;???Antral erosions s/p biopsy for H.pylori  - h. Pylori negative  Plan  > Protonix 40mg  PO BID for 3 months until f/u EGD  > consulting surgery for cholelithiasis; f/u recs  ???  Right UPJ Nephrolithiasis  -9mm stone noted in R UPJ  -alternating R and L back pain, described as sharp and above the hip  -pt unaware of h/o stones. ???  - 7/13 UA negative for infection  - per urology following repeat CT 7/20: no acute surgical intervention, keep f/u appointment for Aug 5th  Plan  >???Follow up with urology as outpatient  ???  Chest Pain, resolved  RBBB  -c/o lower chest/upper abdomen bilateral sharp pain in band-like manner   -trops negative EKG RBBB at OSH. No ekg at [facility]  - previous EKG showing RBBB, so  not new  - troponin 0.03 7/13  -???EKG???7/13: RBBB, inferior infarct, age indeterminate  - pain was actually more epigastric and resolved with GI cocktail  Plan  - continue to monitor  ???  Normocytic Anemia  - patient admitted with Hgb of 12.4; has been decreasing steadily ever since to 8.2 on 7/22 - 7/16 EUS with grade D esophagitis  - patient refused bowel prep for colonoscopy; last colonoscopy in 2017 normal  - CT abd pelvis 7/20 without evidence of retroperitoneal bleed or frank bleed otherwise  Plan  > CTM, source of anemia unknown  ???  ???  DVT ppx:???lovenox  Fluids:???none  Electrolytes:???replace prn???  Nutrition:???regular diet  PT/OT:???consulted???  Dispo:???plan for d/c 7/24  ???  Patient seen & plan discussed with Dr.???Abebe  ???  Howard Pouch, MD  Anesthesiology, PGY-1  Available on [employer]  Pager [phone]________________________________________________________________________    Subjective  Patient was wheeling down to IR for drain maneuvering this morning. Feeling well. No new symptoms, no events overnight.     Feels like hiccups are much improved after IR procedure. He thinks the thorazine 10 TID helped decrease the hiccups and the drain maneuvering helped eliminate them.      ROS  Review of Systems   Constitutional: Negative for chills and fever.   Respiratory: Negative for shortness of breath.    Cardiovascular: Negative for chest pain.   Gastrointestinal: Negative for abdominal pain, constipation and diarrhea.   Genitourinary: Negative for dysuria.   Musculoskeletal: Negative for myalgias.   Skin: Negative for rash.   Neurological: Negative for dizziness and headaches.   Psychiatric/Behavioral: Negative for depression.       Medications  Scheduled Meds:alum/mag hydroxide/simeth (MYLANTA) oral suspension 30 mL, 30 mL, Oral, Q4H  chlorproMAZINE (THORAZINE) tablet 10 mg, 10 mg, Oral, TID  ertapenem (INVanz) IVP 1 g, 1 g, Intravenous, Q24H*  lidocaine (LIDODERM) 5 % topical patch 1 patch, 1 patch, Topical, QDAY    And  Verification of Patch Placement and Integrity - Lidocaine 5%, , Transdermal, BID  melatonin tablet 5 mg, 5 mg, Oral, QHS  pantoprazole DR (PROTONIX) tablet 40 mg, 40 mg, Oral, BID(11-21)  senna/docusate (SENOKOT-S) tablet 1 tablet, 1 tablet, Oral, BID sodium chloride PF 0.9% flush 5-10 mL, 5-10 mL, Flush, FLUSH TID  sodium chloride PF 0.9% syringe 10 mL, 10 mL, Intravenous, Q8H*    Continuous Infusions:  PRN and Respiratory Meds:benzonatate TID PRN, polyethylene glycol 3350 QDAY PRN, simethicone Q6H PRN        Objective:                          Vital Signs: Last Filed                 Vital Signs: 24 Hour Range   BP: 138/67 (07/23 0344)  Temp: 36.7 ???C (98 ???F) (07/23 0344)  Pulse: 89 (07/23 0344)  Respirations: 24 PER MINUTE (07/23 0344)  SpO2: 96 % (07/23 0344) BP: (125-142)/(39-67)   Temp:  [36.4 ???C (97.5 ???F)-36.7 ???C (98.1 ???F)]   Pulse:  [62-89]   Respirations:  [18 PER MINUTE-24 PER MINUTE]   SpO2:  [96 %-99 %]      Vitals:    11/20/18 0132 11/27/18 1514   Weight: 83.9 kg (185 lb) 83.9 kg (185 lb)       Intake/Output Summary:  (Last 24 hours)    Intake/Output Summary (Last 24 hours) at 11/30/2018 0720  Last data filed at 11/30/2018 [phone]  Gross per 24 hour   Intake 40 ml   Output 1020 ml   Net -980 ml      Stool Occurrence: 1      Physical exam:  Physical Exam   Constitutional: He is oriented to person, place, and time and well-developed, well-nourished, and in no distress.   HENT:   Head: Normocephalic and atraumatic.   Eyes: No scleral icterus.   Neck: Normal range of motion.   Cardiovascular: Normal rate.   Pulmonary/Chest: Effort normal.   Abdominal: He exhibits no distension.   Neurological: He is alert and oriented to person, place, and time.   Skin: Skin is warm and dry.   Psychiatric: Affect normal.         Labs   24-hour labs:    Results for orders placed or performed during the hospital encounter of 11/20/18 (from the past 24 hour(s))   CBC AND DIFF    Collection Time: 11/30/18  5:35 AM   Result Value Ref Range    White Blood Cells 12.6 (H) 4.5 - 11.0 K/UL    RBC 2.37 (L) 4.4 - 5.5 M/UL    Hemoglobin 8.0 (L) 13.5 - 16.5 GM/DL    Hematocrit 16.1 (L) 40 - 50 %    MCV 96.9 80 - 100 FL    MCH 33.9 26 - 34 PG    MCHC 35.0 32.0 - 36.0 G/DL RDW 09.6 11 - 15 %    Platelet Count 404 (H) 150 - 400 K/UL    MPV 8.3 7 - 11 FL    Neutrophils 87 (H) 41 - 77 %    Lymphocytes 7 (L) 24 - 44 %    Monocytes 6 4 - 12 %    Eosinophils 0 0 - 5 %    Basophils 0 0 - 2 %    Absolute Neutrophil Count 10.90 (H) 1.8 - 7.0 K/UL    Absolute Lymph Count 0.84 (L) 1.0 - 4.8 K/UL    Absolute Monocyte Count 0.77 0 - 0.80 K/UL    Absolute Eosinophil Count 0.04 0 - 0.45 K/UL    Absolute Basophil Count 0.05 0 - 0.20 K/UL   COMPREHENSIVE METABOLIC PANEL    Collection Time: 11/30/18  5:35 AM   Result Value Ref Range    Sodium 134 (L) 137 - 147 MMOL/L    Potassium 4.1 3.5 - 5.1 MMOL/L    Chloride 103 98 - 110 MMOL/L    Glucose 103 (H) 70 - 100 MG/DL    Blood Urea Nitrogen 15 7 - 25 MG/DL    Creatinine 0.45 0.4 - 1.24 MG/DL    Calcium 7.1 (L) 8.5 - 10.6 MG/DL    Total Protein 5.5 (L) 6.0 - 8.0 G/DL    Total Bilirubin 1.4 (H) 0.3 - 1.2 MG/DL    Albumin 2.2 (L) 3.5 - 5.0 G/DL    Alk Phosphatase 409 (H) 25 - 110 U/L    AST (SGOT) 40 7 - 40 U/L    CO2 23 21 - 30 MMOL/L    ALT (SGPT) 59 (H) 7 - 56 U/L    Anion Gap 8 3 - 12    eGFR Non African American >60 >60 mL/min    eGFR African American >60 >60 mL/min       Imaging Reviewed  none

## 2018-11-30 NOTE — Progress Notes
Sedation physician present in room.  Recent vitals and patient condition reviewed between sedating physician and nurse.  Reassessment completed.  Determination made to proceed with planned sedation.

## 2018-11-30 NOTE — Consults
Pre Procedure History and Physical/Sedation Plan  A full consult has been completed regarding this patient during this admission previously. Therefore this consult will only link to an updated pre-procedure H&P. For details of patient assessment, please see previous IR consult. Thank you.      Procedure Date:  11/30/2018    Planned Procedure(s): Removal of left hepatic drain with replacement via a different approach    Indication for exam: Hiccups  ________________________________________________________________    Chief Complaint:   See above     Previous Anesthetic/Sedation History:  Patient denies adverse event.     Allergies:  Patient has no known allergies.  Medications:  Scheduled Meds:[MAR Hold] alum/mag hydroxide/simeth (MYLANTA) oral suspension 30 mL, 30 mL, Oral, Q4H  [MAR Hold] chlorproMAZINE (THORAZINE) tablet 10 mg, 10 mg, Oral, TID  ertapenem (INVanz) IVP 1 g, 1 g, Intravenous, Q24H*  [MAR Hold] lidocaine (LIDODERM) 5 % topical patch 1 patch, 1 patch, Topical, QDAY    And  [MAR Hold] Verification of Patch Placement and Integrity - Lidocaine 5%, , Transdermal, BID  [MAR Hold] melatonin tablet 5 mg, 5 mg, Oral, QHS  [MAR Hold] pantoprazole DR (PROTONIX) tablet 40 mg, 40 mg, Oral, BID(11-21)  [MAR Hold] senna/docusate (SENOKOT-S) tablet 1 tablet, 1 tablet, Oral, BID  [MAR Hold] sodium chloride PF 0.9% flush 5-10 mL, 5-10 mL, Flush, FLUSH TID  [MAR Hold] sodium chloride PF 0.9% syringe 10 mL, 10 mL, Intravenous, Q8H*    Continuous Infusions:  PRN and Respiratory Meds:[MAR Hold] benzonatate TID PRN, [MAR Hold] polyethylene glycol 3350 QDAY PRN, [MAR Hold] simethicone Q6H PRN       Vital Signs:  Last Filed Vital Signs: 24 Hour Range   BP: 128/47 (07/23 0825)  Temp: 36.5 ???C (97.7 ???F) (07/23 0825)  Pulse: 64 (07/23 0825)  Respirations: 24 PER MINUTE (07/23 0344)  SpO2: 100 % (07/23 0825)  SpO2 Pulse: 64 (07/23 0825) BP: (125-142)/(39-67)   Temp:  [36.4 ???C (97.5 ???F)-36.7 ???C (98.1 ???F)]   Pulse:  [64-89] Respirations:  [20 PER MINUTE-24 PER MINUTE]   SpO2:  [96 %-100 %]      Pre-procedure anxiolysis plan: Midazolam  Sedation/Medication Plan: Fentanyl, Lidocaine and Midazolam  Personal history of sedation complications: Denies adverse event.   Family history of sedation complications: Denies adverse event.   Medications for Reversal: Naloxone and Flumazenil  Discussion/Reviews:  Physician has discussed risks and alternatives of this type of sedation and above planned procedures with patient    NPO Status: Acceptable  Airway:  airway assessment performed  Mallampati III (soft palate, base of uvula visible)  Head and Neck: no abnormalities noted  Mouth: no abnormalities noted   Anesthesia Classification:  ASA III (A patient with a severe systemic disease that limits activity, but is not incapacitating)  Pregnancy Status: Not Pregnant    Lab/Radiology/Other Diagnostic Tests  Labs:  Relevant labs reviewed    I have examined the patient, and there are no significant changes in their condition, from the previous H&P performed on 11/23/2018.    Toya Smothers, [facility]  Pager [phone]

## 2018-11-30 NOTE — Progress Notes
PHYSICAL THERAPY  PROGRESS NOTE        Name: Tyler Castillo        MRN: [id number]          DOB: 1934-12-18          Age: 84 y.o.  Admission Date: 11/20/2018             LOS: 10 days        Mobility  Patient Turn/Position: Chair  Progressive Mobility Level: Walk in room  Distance Walked (feet): 20 ft(x2, not max distance/focused on strengthening today)  Level of Assistance: Assist X1  Assistive Device: Walker  Time Tolerated: 11-30 minutes  Activity Limited By: Fatigue;Weakness;Shortness of air    Subjective  Significant hospital events: Admitted for recurrent liver mass (drain placed 7/15), R nephrolithiasis, and weakness/malaise.  Recent admission to OSH for dehydration (7/9-7/9).  PMH:  PMH, liver mass (10/2017).  Mental / Cognitive Status: Alert;Oriented;Cooperative  Pain: Patient complains of pain  Pain Location: Right(side, drain insertion)  Pain Description: (soreness)  Pain Interventions: Patient agrees to participate in therapy  Comments: Assisted fall 7/22 am.    Ambulation Assist: Independent Mobility in Community without Device  Patient Owned Equipment: None  Home Situation: Lives Alone  Type of Home: House  Entry Stairs: 1-2 Stairs;Rail on Both Sides  In-Home Stairs: Able to Live on One Level  Comments: Reports fall 2-3 wks. ago but no other incidents in the last year.  Son/DIL nearby and assist with meal prep and community mobility, as needed.    Bed Mobility/Transfer  Bed Mobility: Supine to Sit: Standby Assist;Head of Bed Elevated;Use of Rail;Verbal Cues;Requires Extra Time  Transfer Type: Sit to Stand(multiple reps throughout session, x5 repeated for strength)  Transfer: Assistance Level: From;Bed;Bed Side Chair;Moderate Assist  Transfer: Assistive Device: Nurse, adult  Transfers: Type Of Assistance: For Balance;For Strength Deficit;Verbal Cues;For Safety Considerations  Other Transfer Type: Stand to Sit  Other Transfer: Assistance Level: To;Bed;Bed Side Chair;Minimal Assist(poor control w/descent, cues to reach back)  Other Transfer: Assistive Device: Nurse, adult  Other Transfer: Type Of Assistance: For Balance;For Strength Deficit;Verbal Cues;For Safety Considerations  End Of Activity Status: Up in Chair;Nursing Notified;Instructed Patient to Request Assist with Mobility;Instructed Patient to Use Call Light(chair alarm activated, call light in reach)    Gait  Gait Distance: 20 feet(x2, not max distance)  Gait: Assistance Level: Minimal Assist  Gait: Assistive Device: Roller Walker  Gait: Descriptors: Decreased foot clearance RLE;Decreased foot clearance LLE;Decreased heel strike RLE;Decreased heel strike LLE;Forward trunk flexion;Pace: Slow;Decreased step length;Decreased knee extension in stance phase RLE;Decreased knee extension in stance phase LLE  Comments: Slow, shuffled gait pattern. Patient takes choppy short steps.   Activity Limited By: Weakness;Complaint of Fatigue    Activity/Exercise  Comments: Repeated sit-to-stands x5 reps, LAQ, seated marches.     Assessment/Progress  Impaired Mobility Due To: Decreased Strength;Impaired Balance;Deconditioning  Assessment/Progress: Should Improve w/ Continued PT  Comments: Focused on proximal strengthening this date. Patient continues to demonstate decreased strength, endurance, and functional independence. Will benefit from ongoing intervention.     AM-PAC 6 Clicks Basic Mobility Inpatient  Turning from your back to your side while in a flat bed without using bed rails: A Little  Moving from lying on your back to sitting on the side of a flatbed without using bedrails : A Little  Moving to and from a bed to a chair (including a wheelchair): A Little  Standing up from a chair using your arms (e.g. wheelchair, or  bedside chair): A Lot  To walk in hospital room: A Lot  Climbing 3-5 steps with a railing: Total  Raw Score: 14  Standardized (T-scale) Score: 35.55  Basic Mobility CMS 0-100%: 53.86 CMS G Code Modifier for Basic Mobility: CK    Goals  Goal Formulation: With Patient  Time For Goal Achievement: 7 days  Patient Will Go Supine To/From Sit: w/ Stand By Assist, Ongoing  Patient Will Transfer Sit to Stand: w/ Stand By Assist, Ongoing  Patient Will Ambulate: Greater than 200 Feet, w/ Stand By Assist, Ongoing(least restrictive device)  Patient Will Go Up / Down Stairs: 1-2 Stairs, w/ Stand By Assist, Ongoing    Plan  Treatment Interventions: Mobility Training;Strengthening;Balance Activities  Plan Frequency: 5 Days per Week  PT Plan for Next Visit: Gait with chair follow, repeated sit to stand repetitions.     PT Discharge Recommendations  Recommendation: Inpatient setting  Comments: Needs to be mod I to return home - was not using device prior to admission but currently utilizing RW.    Therapist: Charlsie Merles, PT, DPT [phone]   Date: 11/30/2018

## 2018-11-30 NOTE — Other
Immediate Post Procedure Note    Date:  11/30/2018                                         Attending Physician:   Derrek Gu, MD  Performing Provider:  Abran Cantor, DO    Consent:  Consent obtained from patient.  Time out performed: Consent obtained, correct patient verified, correct procedure verified, correct site verified, patient marked as necessary.  Pre/Post Procedure Diagnosis:  Hepatic abscess  Indications:  Drain malfunction.     Anesthesia: Conscious Sedation  Procedure(s):  CT guided drain placement.   Findings:  Successful removal and replacement of a L hepatic drain. No significant aspirate obtained. No bleeding along prior drain tract following removal.      Estimated Blood Loss:  None/Negligible  Specimen(s) Removed/Disposition:  None  Complications: None  Patient Tolerated Procedure: Well  Post-Procedure Condition:  stable    Abran Cantor, DO

## 2018-11-30 NOTE — Case Management (ED)
Case Management Progress Note    NAME:Tyler Castillo                          MRN: [id number]              DOB:1934/12/31          AGE: 84 y.o.  ADMISSION DATE: 11/20/2018             DAYS ADMITTED: LOS: 10 days      Todays Date: 11/30/2018    Plan  D/c to [facility] pending medical stability.    Interventions  ? Support   Support: Pt/Family Updates re:POC or DC Plan     ? Info or Referral   Information or Referral to Community Resources: No Needs Identified     ? Discharge Planning   Discharge Planning: [place]    Update 1015:   [place] called [facility] [ph: [phone]; f: [phone] and updated that pt not d/c today.   Currently pt in IR for left hepatic drain removal    ? Medication Needs   Medication Needs: No Needs Identified      Financial   Financial: No Needs Identified     ? Legal   Legal: No Needs Identified     ? Other   Other/None: No needs identified    Disposition  ? Expected Discharge Date    Expected Discharge Date: 12/04/18  Expected Discharge Time: 1500  ? Transportation   Does the patient need discharge transport arranged?: No  Transportation Name, Phone and Availability #1: [place](Son)-Ph-[phone]  Does the patient use Medicaid Transportation?: No  ? Next Level of Care (Acute Psych discharges only)      ? Discharge Disposition        Durable Medical Equipment      No service has been selected for the patient.      [place] Dest[facility]      No service has been selected for the patient.      [facility] [place]      No service has been selected for the patient.      [facility]      No service has been selected for the patient.        Clemens Catholic, [place], [place] [facility]  Pager: [phone]  Desk: [phone]

## 2018-11-30 NOTE — Care Plan
Problem: Infection, Risk of  Goal: Absence of infection  Outcome: Goal Ongoing     Problem: Falls, High Risk of  Goal: Absence of falls-Adult Patient  Outcome: Goal Ongoing     Problem: Discharge Planning  Goal: Participation in plan of care  Outcome: Goal Ongoing  Goal: Knowledge regarding plan of care  Outcome: Goal Ongoing  Goal: Prepared for discharge  Outcome: Goal Ongoing     Problem: Pain  Goal: Management of pain  Outcome: Goal Ongoing  Goal: Knowledge of pain management  Outcome: Goal Ongoing  Goal: Progress Toward Pain Management Goals  Outcome: Goal Ongoing     Problem: Mobility/Activity Intolerance  Goal: Maximize functional ADL's and mobility outcomes  Outcome: Goal Ongoing     Problem: Self-Care Deficit  Goal: Maximize ADL functioning  Outcome: Goal Ongoing     Problem: Infection, Risk of, Central Venous Catheter-Associated Bloodstream Infection  Goal: Absence of CVC Associated Bloodstream infection  Outcome: Goal Ongoing

## 2018-11-30 NOTE — Progress Notes
Infectious Disease Progress Note    Today's Date:  11/30/2018  Admission Date: 11/20/2018    Reason for this consultation: Patient with history of hepatic mass of unclear etiology with prior cultures growing GPC resembling strep. Treated with ertapenem. Patient presenting with new similar appearing lesion. Please assist with antibiotic recommendations.    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 No viable tumor cells are present. ???The finding may represent reaction adjacent to mass lesion, or may represent a liver abscess.  -10/26/17 normal liver biopsy: Mild sinusoidal dilation and changes consistent with biliary obstruction. No significant fibrosis.   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 Review of outside CT by IR - felt significantly improved and drain could be removed   -11/21/17 IR drain removal. Abscessogram- no fistula -12/01/17 finished ertapenem and started amox/clav, which he finished ~12/17/17  -12/22/17 another 14-day course of augmentin prescribed  -11/19/18 patient presented to [facility] ED with weakness, malaise, right-sided chest discomfort  -11/19/18 CT abdomen/pelvis: 7.2 x 8.9 cm heterogenous mass within hepatic dome posterior segment of R hepatic lobe; increased in size from prior study (July 19); interval development of 2.9x 3.0cm heterogenous mass within L hepatic lobe; interval development of mild (R) sided hydronephrosis secondary to 9mm calculus of right UPJ  -7/12 CXR: Cardiomegaly, chronic small right pleural effusion, right basilar atelectasis  -Leukocytosis and elevated liver enzymes  -7/13 BC x 2 sets NTD  -7/13 fungal blood cultures x2 sets NTD  -7/14 CT abdomen and pelvis: There is persistent cholelithiasis. The hepatic abscess previously demonstrated in the caudal right lobe adjacent to the gallbladder is not distinctly visualized and has likely resolved. Development of 2 new adjacent hepatic low-density lesions in the cephalad liver. The larger is centered in hepatic segments 7/8 and measures approximately 10 x 7 x 9 cm (axial image 17 and coronal image 87). The adjacent smaller abscess is within hepatic segment 4A and measures approximately 5 x 3.5 x 4.5 cm (axial image 15 and coronal image 50).  -7/15 IR placed a drain in the smaller left-sided hepatic abscess; 20 mL of bloody purulent fluid was aspirated.  No biopsy done and no drain placed in the large right-sided hepatic abscess  -7/15 smaller left-sided hepatic abscess fluid GS: Many PMN, no org; routine culture NTD.  Anaerobic culture + MG Fusobacterium nucleatum  -7/15 fluid cytology: Acute inflammation and debris. Negative for malignant cells.   -7/16 large right-sided hepatic abscess fluid GS: Many PMN, no org; routine culture + E coli.  Anaerobic culture +  LG Fusobacterium nucleatum -7/16 ERCP: Injection of contrast revealed a 5 mm CBD with no distal filling defect suggestive of stone. A guide wire was passed into the intrahepatics and a 7 mm traction biliary sphincterotomy was performed. There was no bleeding. Then a stone extraction balloon was swept multiple times through the duct no stone was extracted. Following this???a balloon occlusion cholangiogram was performed and did showe small intrahepatic ducts with a connection between the right anterior duct with one of the abscess.  -7/20 CT abdomen/pelvis w/: Decreased dominant segment 7/8 and unchanged segment 4A multiloculated fluid collections. Interval placement of percutaneous drainage catheters with development of internal foci of gas. Diverticulosis with development of mild uncomplicated diverticulitis at the junction of the descending and sigmoid colon. Nonobstructive 1.0 cm right UVJ and left lower pole renal calculi. Urology consultation should be considered. Asymmetric hypoenhancement of visualized right superficial and deep femoral veins which is likely flow related, though ultrasound recommended to exclude deep venous thrombosis. Cholelithiasis.  -7/23 removal and replacement of a L hepatic abscess drain (to minimize hiccups).    Right hydronephrosis  Right UPJ nephrolithiasis  -11/19/2018 CT abdomen and pelvis: 9mm calculus of right UPJ  -7/14 CT abdomen and pelvis: Right ureteropelvic junction calculus measuring 6 mm with mild upstream pelvocaliectasis without frank hydronephrosis. Adjacent urothelial thickening likely reflects inflammation. Persistent small left lower pole nonobstructing renal calculus.    History of AKI- resolved    History of Viral pericarditis status-post pericardiectomy in 1993    Hyperlipidemia    Former tobacco use (quit 1978)    Recommendations:     1. Continue ertapenem   2. Anticipate at least 1 month of IV antibiotic therapy  3. While on ertapenem, the patient will need weekly CBC with differential and CMP  4. We will keep the drains in place after discharge  5. Anticipate repeat CT abdomen and pelvis in around 3 weeks  6. I will arrange for follow-up with me  7. Follow-up cultures   8. Monitor drains' output  9. Monitor temp and WBC count    We'll follow  __________________________________________________________________________________    Subjective/Interval History     Patient remains afebrile  Reports occasional hiccups  No shortness of breath  Has a cough productive of white phlegm  No nausea, vomiting or diarrhea  Experiencing right upper quadrant abdominal pain after drain exchange  No dysuria???    WBC 12.6  Hb 8  Platelets 404  Creatinine 0.85  AST 40-improved  ALT 59-improved  ALP 440-improved   T bili 1.4-improved    Drains output in the past 24 hours:   #1:  50 mL (flushed with 20 mL)  #2:  70 mL (flushed with 20 mL)    Discussed with Dr. Orson Ape.  ???  Antimicrobial Start date End date   Zosyn   6/17 x 1 dose; 6/19-6/25/19   11/19/2018 x1 dose ???   Ertapenem  6/25-7/25/19; 11/20/2018  active   Augmentin  7/25-8/10; 12/22/17 ???01/05/18   ??? ??? ???   ??? ??? ???   ??? ??? ???   ??? ??? ???   Estimated Creatinine Clearance: 70.6 mL/min (based on SCr of 0.85 mg/dL).    Medications   Scheduled Meds:[MAR Hold] alum/mag hydroxide/simeth (MYLANTA) oral suspension 30 mL, 30 mL, Oral, Q4H  [MAR Hold] chlorproMAZINE (THORAZINE) tablet 10 mg, 10 mg, Oral, TID  ertapenem (INVanz) IVP 1 g, 1 g, Intravenous, Q24H*  [MAR Hold] lidocaine (LIDODERM) 5 % topical patch 1 patch, 1 patch,  Topical, QDAY    And  [MAR Hold] Verification of Patch Placement and Integrity - Lidocaine 5%, , Transdermal, BID  [MAR Hold] melatonin tablet 5 mg, 5 mg, Oral, QHS  [MAR Hold] pantoprazole DR (PROTONIX) tablet 40 mg, 40 mg, Oral, BID(11-21)  [MAR Hold] senna/docusate (SENOKOT-S) tablet 1 tablet, 1 tablet, Oral, BID  [MAR Hold] sodium chloride PF 0.9% flush 5-10 mL, 5-10 mL, Flush, FLUSH TID  [MAR Hold] sodium chloride PF 0.9% syringe 10 mL, 10 mL, Intravenous, Q8H* Continuous Infusions:    PRN and Respiratory Meds:[MAR Hold] benzonatate TID PRN, [MAR Hold] polyethylene glycol 3350 QDAY PRN, [MAR Hold] simethicone Q6H PRN      Allergies   No Known Allergies      Physical Examination                          Vital Signs: Last                  Vital Signs: 24 Hour Range   BP: 138/67 (07/23 0344)  Temp: 36.7 ???C (98 ???F) (07/23 0344)  Pulse: 89 (07/23 0344)  Respirations: 24 PER MINUTE (07/23 0344)  SpO2: 96 % (07/23 0344) BP: (125-142)/(39-67)   Temp:  [36.4 ???C (97.5 ???F)-36.7 ???C (98.1 ???F)]   Pulse:  [71-89]   Respirations:  [20 PER MINUTE-24 PER MINUTE]   SpO2:  [96 %-99 %]      General appearance: Alert, lethargic, in NAD  HENT: No thrush, coated tongue  Lungs: Clear breath sounds anteriorly  Heart: Regular rhythm, reg rate, no murmur, rub, gallop  Abdomen: Soft, tender around the new drain, non-distended, J-Vac drain in the right upper lateral abdomen, J-Vac drain in the right upper quadrant   Ext: No clubbing, cyanosis; mild bilateral lower extremity edema  Skin: No rash    Lines:   R arm PICC line 7/19  PIV      Lab Review   Hematology  Recent Labs     11/28/18  0450 11/29/18  0310 11/30/18  0535   WBC 15.8* 13.0* 12.6*   HGB 8.4* 8.2* 8.0*   HCT 24.4* 23.3* 23.0*   PLTCT 385 397 404*   INR  --  1.4*  --      Chemistry  Recent Labs     11/28/18  0450 11/29/18  0310 11/30/18  0535   NA 136* 135* 134*   K 4.2 4.2 4.1   CL 104 103 103   CO2 25 26 23    BUN 20 20 15    CR 0.88 0.91 0.85   GFR >60 >60 >60   GLU 93 107* 103*   CA 7.1* 7.1* 7.1*   ALBUMIN 2.3* 2.3* 2.2*   ALKPHOS 504* 477* 440*   AST 52* 46* 40   ALT 75* 63* 59*   TOTBILI 1.8* 1.5* 1.4*       Microbiology, Radiology and other Diagnostics Review   Microbiology data reviewed.    Pertinent radiology images viewed.    7/21 RLE US/Doppler:  No right lower extremity deep venous thrombosis    7/20 CT abdomen/pelvis w/:  1. Decreased dominant segment 7/8 and unchanged segment 4A multiloculated fluid collections which remain most compatible with recurrent abscesses.   Interval placement of percutaneous drainage catheters with development of   internal foci of gas.  2. Diverticulosis with development of mild uncomplicated diverticulitis at   the junction of the descending and sigmoid colon.  3. Nonobstructive  1.0 cm right UVJ and left lower pole renal calculi.   Urology consultation should be considered.  4. Asymmetric hypoenhancement of visualized right superficial and deep   femoral veins which is likely flow related, though ultrasound recommended   to exclude deep venous thrombosis.  5. Cholelithiasis.    7/15 CXR:  1. Heart size is mildly enlarged with mild pulmonary vascular congestion.  2. Small right pleural effusion with bibasilar opacities compatible with   atelectasis and/or pneumonia.    7/14 CT abdomen/pelvis:  1. Development of 2 new adjacent hepatic abscess/masses in the cephalad   liver, as described, the larger measuring up to 10 cm. Given prior   clinical history, these likely reflect recurrent abscesses however   underlying hepatic mass such as cholangiocarcinoma could appear similar.  2. Right ureteropelvic junction calculus measuring 6 mm with mild upstream   pelvocaliectasis without frank hydronephrosis. Adjacent urothelial   thickening likely reflects inflammation. Urinalysis is recommended.  3. Persistent small left lower pole nonobstructing renal calculus.  4. Marked sigmoid colon diverticulosis.  5. 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      Theotis Burrow, MD  Division of Infectious Diseases   Pager [phone]

## 2018-11-30 NOTE — Discharge Instructions - Pharmacy
Physician Discharge Summary      Name: Tyler Castillo  Medical Record Number: [id number]        Account Number:  [id number]  Date Of Birth:  27-Sep-1934                         Age:  84 years   Admit date:  11/20/2018                     Discharge date:  12/01/2018    Attending Physician:  Dr. Orson Ape               Service: Med 3- 2071    Physician Summary completed by: Howard Pouch, MD     Reason for hospitalization: Hepatic Mass, leukocytosis    Significant PMH:   Medical History:   Diagnosis Date   ??? Hyperlipidemia    ??? Renal failure            Allergies: Patient has no known allergies.    Admission Physical Exam notable for:  none      Admission Lab/Radiology studies notable for: OSH gram stain of perihepatic fluid GPC resembling strep, AST/ALT/ALP elevation, OSH CT abdomen/pelvis with interval increase in hepatic abscess, incidental finding of right nephrolithiasis and hydronephrosis  Leukocytosis with white count of 19.9, AST 236, ALT 343, ALP 422; blood cx negative    Brief Hospital Course:  The patient was admitted and the following issues were addressed during this hospitalization: (with pertinent details).      Tyler Castillo is an 84 year old male with past medical history of prior hepatic abscess s/p drainage with incomplete resolution, and hyperlipidemia who presented to OSH 11/19/2018 with a 6 day history of generalized malaise. At the OSH he was found to have a leukocytosis of 24 and a CT abdomen/pelvis showed interval increase in hepatic abscess along with indicental finding of right nephrolithiasis and hydronephrosis. Patient was started on Zosyn and transferred to [place] on 7/13 for higher level of care. A CT abdomen/pelvis at this institution was ordered and read and it revealed 2 new adjacent hepatic abscesses as well as a right UPJ calculus with hydronephrosis and diverticulosis. Urology was consulted for his nephrolithiasis and indicated no acute surgical need at this time and the patient can follow up outpatient. ID was consulted and started patient on IV ertapenem 7/13. Patient went to IR for liver abscess drainage/aspiration/biopsy on 7/15 and 7/16 for L and R abscesses respectively. Temporary drains were lef tin posterolateral and interolateral left abdomen. Cultures came back growing fusobacterium in the L abscess and e. Coli in the R abscess. Hepatology was consulted and performed an EUS/ERCP to evaluate bile ducts given recurrent hepatic abscesses. These tests revealed LA grade D esophagitis, multiple gastric erosions (bx obtained and neg for h pylori), and small intrahepatic ducts with connection between right anterior duct with one of the abscesses. He was placed on PPI pantoprazole 40mg  BID for esophagitis and will continue this for 3 months until follow up EGD. Hepatology also recommended a colonoscopy, but patient refused bowel prep and said that a colonoscopy was not an option for him at this time. Following a few days of drainage from hepatic abscesses, patient had a repeat CT abdomen pelvis to assess for evidence of malignancy following decreasing size of abscesses. This was negative for obvious malignancy. It did show cholelithiasis and diverticulitis. General surgery was consulted and indicated that there was no acute surgical  intervention needed for this patient and that he could follow up outpatient for both issues. Patient was having intractable hiccups following the placement of the hepatic drains x2. He was treated with mylanta and baclofen which did not improve hiccups. Thorazine 10mg  TID helped slightly. 7/23 patient went to IR for drain re-arranging to avoid diaphragmatic irritation. This improved patient's hiccups greatly. Patient will discharge on 4 weeks of IV ertapenem and will keep his hepatic drains for 2 weeks until follow up CT is done for monitoring.       Condition at Discharge: Stable    Discharge Diagnoses:        Hospital Problems Active Problems    * (Principal) Liver mass    Leucocytosis          Surgical Procedures: IR drainage and bx of hepatic abscesses x2    Significant Diagnostic Studies and Procedures: noted in brief hospital course    Consults:  GI, ID and IR    Patient Disposition: Skilled Nursing Facility       Patient instructions/medications:       COMPREHENSIVE METABOLIC PANEL   Standing Status: Standing Number of Occurrences: 4 Standing Exp. Date: 03/03/19    Please fax results to [phone]. Attn Dr. Monika Salk     Which provider would you like to CC? EID, ALBERT [[id number]]      COMPREHENSIVE METABOLIC PANEL   Standing Status: Standing Number of Occurrences: 4 Standing Exp. Date: 03/03/19    Please fax results to [phone]. Attn Dr. Monika Salk     Which provider would you like to CC? EID, ALBERT [[id number]]      Regular Diet    You have no dietary restriction. Please continue with a healthy balanced diet.     Testing Not Required for Covid-19     Testing Not Required for Covid-19     Questions About Your Stay    If you have an emergency after discharge, please dial 9-1-1.    You may contact your discharging physician up to 7 days after discharge for questions about your hospitalization, discharge instructions, or medications by calling [phone] during regular business hours (8AM-4PM) and asking to speak to the doctor listed on the discharge information.       If you are not calling during business hours, ask for the on-call doctor.    If you have new  or worsening symptoms, you may be directed to your primary care provider (PCP) for ongoing questions, an Emergency Department, or an Urgent Care Clinic for a more immediate evaluation.    For all calls or questions more than 7 days after discharge, please contact your primary care provider (PCP).    For medications after discharge: pain (opioid) medicine cannot be refilled or prescribed by calling your discharging physician.  These medications need to be filled by your primary care provider (PCP).  Regular refill requests should be directed to your primary care provider (PCP).     Discharging attending physician: Sanjuan Dame [[id number]]      Activity as Tolerated    It is important to keep increasing your activity level after you leave the hospital.  Moving around can help prevent blood clots, lung infection (pneumonia) and other problems.  Gradually increasing the number of times you are up moving around will help you return to your normal activity level more quickly.  Continue to increase the number of times you are up to the chair and walking daily to return to your normal activity level.  Begin to work toward your normal activity level at discharge     Report These Signs and Symptoms    Please contact your doctor if you have any of the following symptoms: temperature higher than 100.4 degrees F, uncontrolled pain, persistent nausea and/or vomiting, difficulty breathing, chest pain, severe abdominal pain or unable to have bowel movement     Complete if patient is going to a Skilled Nursing Facility     I certify that the patient requires skilled care Yes    The patient's stay is expected to be less than 30 days Yes    I will be in charge of patient in nursing home No       Current Discharge Medication List       START taking these medications    Details   alum/mag hydroxide/simeth (MYLANTA) 200/200/20 mg/5 mL oral suspension Take 30 mL by mouth every 4 hours as needed.    PRESCRIPTION TYPE:  No Print      chlorproMAZINE (THORAZINE) 10 mg tablet Take one tablet by mouth three times daily as needed. For hiccups.    PRESCRIPTION TYPE:  No Print      ertapenem (INVANZ) 1 g/10 mL IVP Administer 10 mL through vein every 24 hours. Stop date 12/22/2018 or as otherwise stated by infectious disease physician    PRESCRIPTION TYPE:  No Print      melatonin 5 mg tab Take one tablet by mouth at bedtime as needed.    PRESCRIPTION TYPE:  No Print pantoprazole DR (PROTONIX) 40 mg tablet Take one tablet by mouth twice daily.    PRESCRIPTION TYPE:  No Print      polyethylene glycol 3350 (MIRALAX) 17 g packet Take one packet by mouth daily as needed.    PRESCRIPTION TYPE:  No Print      senna/docusate (SENOKOT-S) 8.6/50 mg tablet Take one tablet by mouth twice daily as needed.    PRESCRIPTION TYPE:  No Print          CONTINUE these medications which have NOT CHANGED    Details   acetaminophen (TYLENOL) 500 mg tablet Take 500 mg by mouth every 6 hours as needed for Pain. Max of 4,000 mg of acetaminophen in 24 hours.    PRESCRIPTION TYPE:  Historical Med      vitamins, multiple cap Take 1 capsule by mouth daily.    PRESCRIPTION TYPE:  Historical Med          The following medications were removed from your list. This list includes medications discontinued this stay and those removed from your prior med list in our system        aspirin EC 81 mg tablet        Calcium Carbonate-Mag Hydroxid (ROLAIDS) 550-110 mg chew        diphenhydrAMINE HCL (ZZZQUIL) 50 mg/30 mL liqd            Scheduled appointments:    Dec 13, 2018  9:00 AM CDT  New Patient with Clarita Crane, MD, [facility]  [facility] (Urology) [address]  [address] [place] [address]  [phone]          Pending items needing follow up: none    Signed:  Howard Pouch, MD  11/30/2018      cc:  Primary Care Physician:  Rockwell Germany   Verified  Referring physicians:  Self, Referral   Additional provider(s):

## 2018-12-01 ENCOUNTER — Encounter: Admit: 2018-11-27 | Discharge: 2018-11-27 | Admitting: Student in an Organized Health Care Education/Training Program

## 2018-12-01 ENCOUNTER — Encounter: Admit: 2018-11-23 | Discharge: 2018-11-23 | Admitting: Student in an Organized Health Care Education/Training Program

## 2018-12-01 ENCOUNTER — Encounter: Admit: 2018-11-28 | Discharge: 2018-11-28 | Admitting: Student in an Organized Health Care Education/Training Program

## 2018-12-01 ENCOUNTER — Encounter: Admit: 2018-11-22 | Discharge: 2018-11-22 | Admitting: Student in an Organized Health Care Education/Training Program

## 2018-12-01 DIAGNOSIS — B9620 Unspecified Escherichia coli [E. coli] as the cause of diseases classified elsewhere: Secondary | ICD-10-CM

## 2018-12-01 DIAGNOSIS — K8020 Calculus of gallbladder without cholecystitis without obstruction: Secondary | ICD-10-CM

## 2018-12-01 DIAGNOSIS — K5730 Diverticulosis of large intestine without perforation or abscess without bleeding: Secondary | ICD-10-CM

## 2018-12-01 DIAGNOSIS — K210 Gastro-esophageal reflux disease with esophagitis: Secondary | ICD-10-CM

## 2018-12-01 DIAGNOSIS — K750 Abscess of liver: Secondary | ICD-10-CM

## 2018-12-01 DIAGNOSIS — Z1159 Encounter for screening for other viral diseases: Secondary | ICD-10-CM

## 2018-12-01 DIAGNOSIS — E785 Hyperlipidemia, unspecified: Secondary | ICD-10-CM

## 2018-12-01 DIAGNOSIS — K5732 Diverticulitis of large intestine without perforation or abscess without bleeding: Secondary | ICD-10-CM

## 2018-12-01 DIAGNOSIS — R066 Hiccough: Secondary | ICD-10-CM

## 2018-12-01 DIAGNOSIS — Z87891 Personal history of nicotine dependence: Secondary | ICD-10-CM

## 2018-12-01 DIAGNOSIS — B9689 Other specified bacterial agents as the cause of diseases classified elsewhere: Secondary | ICD-10-CM

## 2018-12-01 DIAGNOSIS — K254 Chronic or unspecified gastric ulcer with hemorrhage: Secondary | ICD-10-CM

## 2018-12-01 DIAGNOSIS — I4510 Unspecified right bundle-branch block: Secondary | ICD-10-CM

## 2018-12-01 DIAGNOSIS — D72829 Elevated white blood cell count, unspecified: Secondary | ICD-10-CM

## 2018-12-01 DIAGNOSIS — Z66 Do not resuscitate: Secondary | ICD-10-CM

## 2018-12-01 DIAGNOSIS — N132 Hydronephrosis with renal and ureteral calculous obstruction: Secondary | ICD-10-CM

## 2018-12-01 DIAGNOSIS — R160 Hepatomegaly, not elsewhere classified: Principal | ICD-10-CM

## 2018-12-01 LAB — POC GLUCOSE: Lab: 96 mg/dL (ref >60)

## 2018-12-01 LAB — CBC AND DIFF: Lab: 10 K/UL — ABNORMAL LOW (ref >60)

## 2018-12-01 LAB — COMPREHENSIVE METABOLIC PANEL: Lab: 135 MMOL/L — ABNORMAL LOW (ref >60)

## 2018-12-01 MED ORDER — ERTAPENEM 1GM IVP
1 g | INTRAVENOUS | 0 refills | 5.00000 days | Status: DC
Start: 2018-12-01 — End: 2019-01-09

## 2018-12-01 MED ORDER — ALUM-MAG HYDROXIDE-SIMETH 200-200-20 MG/5 ML PO SUSP
30 mL | ORAL | 0 refills | Status: DC | PRN
Start: 2018-12-01 — End: 2018-12-01

## 2018-12-01 MED ORDER — PANTOPRAZOLE 40 MG PO TBEC
40 mg | Freq: Two times a day (BID) | ORAL | 0 refills | 90.00000 days | Status: AC
Start: 2018-12-01 — End: ?

## 2018-12-01 MED ORDER — MELATONIN 5 MG PO TAB
5 mg | Freq: Every evening | ORAL | 0 refills | 28.00000 days | Status: AC | PRN
Start: 2018-12-01 — End: ?

## 2018-12-01 MED ORDER — POLYETHYLENE GLYCOL 3350 17 GRAM PO PWPK
17 g | Freq: Every day | ORAL | 0 refills | 18.00000 days | Status: AC | PRN
Start: 2018-12-01 — End: ?

## 2018-12-01 MED ORDER — SENNOSIDES-DOCUSATE SODIUM 8.6-50 MG PO TAB
1 | Freq: Two times a day (BID) | ORAL | 0 refills | Status: AC | PRN
Start: 2018-12-01 — End: ?

## 2018-12-01 MED ORDER — CHLORPROMAZINE 10 MG PO TAB
10 mg | Freq: Three times a day (TID) | ORAL | 0 refills | 28.50000 days | Status: AC | PRN
Start: 2018-12-01 — End: ?

## 2018-12-01 MED ORDER — ALUM-MAG HYDROXIDE-SIMETH 200-200-20 MG/5 ML PO SUSP
30 mL | ORAL | 0 refills | Status: AC | PRN
Start: 2018-12-01 — End: ?

## 2018-12-01 MED ADMIN — WATER FOR INJECTION, STERILE IJ SOLN [79513]: 20 mL | INTRAVENOUS | @ 14:00:00 | Stop: 2018-12-01 | NDC 00409488723

## 2018-12-01 NOTE — Progress Notes
Tyler Castillo discharged on 12/01/2018.   Marland Kitchen  Discharge instructions reviewed with patient.  Valuables returned:   Personal Items / Valuables: Clothing, Eyeglasses/Contacts, Dentures  Denture Type: Full upper, Partial lower.  Home medications:    .  Functional assessment at discharge complete: Yes    Pt's daughter here to take pt to [facility] facility.

## 2018-12-01 NOTE — Progress Notes
Discharge day progress note: Patient was seen and discussed with rounding internal medicine resident service - Med-3 who I am the attending for.     Subjective:  No events overnight.  This morning, he states the has no hiccup, stated that it is best not to say it as it may come a few minutes later.  He denies any fever chills, chest pain, dyspnea, nausea, vomiting, or leg swelling.  Having bowel movements.     Objective:  24-hour vitals reviewed - stable.    On exam, he is alert, no acute distress.    Heart with normal rate and regular rhythm, no murmurs.    Lungs clear to auscultation bilaterally.    Positive bowel sounds.  No abdominal tenderness to palpation.  Drainage tubes in the right upper quadrant with dressings on        Labs were reviewed   Lab Results   Component Value Date/Time    NA 135 (L) 12/01/2018 03:45 AM    K 4.2 12/01/2018 03:45 AM    CL 105 12/01/2018 03:45 AM    CO2 23 12/01/2018 03:45 AM    GAP 7 12/01/2018 03:45 AM     Lab Results   Component Value Date/Time    WBC 10.6 12/01/2018 03:45 AM    HGB 8.0 (L) 12/01/2018 03:45 AM    PLTCT 427 (H) 12/01/2018 03:45 AM        Assessment and plan:  Plan to continue on Ertapenem for a total of at least 1 month for hepatic abscess  Will need follow up with surgery for potential cholecystectomy   To keep hepatic abscess drains for at least 3 weeks at which time would get a repeat CT of abdomen per ID   To be on Chlorpromazine as needed for hiccup .  Medication reconciliation as noted below  Discharge to [facility]     Breanna, Mcdaniel [facility]   Home Medication Instructions [id number]    Printed on:12/01/18 1245   Medication Information                      acetaminophen (TYLENOL) 500 mg tablet  Take 500 mg by mouth every 6 hours as needed for Pain. Max of 4,000 mg of acetaminophen in 24 hours.             alum/mag hydroxide/simeth (MYLANTA) 200/200/20 mg/5 mL oral suspension  Take 30 mL by mouth every 4 hours as needed. chlorproMAZINE (THORAZINE) 10 mg tablet  Take one tablet by mouth three times daily as needed. For hiccups.             ertapenem (INVANZ) 1 g/10 mL IVP  Administer 10 mL through vein every 24 hours. Stop date 12/22/2018 or as otherwise stated by infectious disease physician             melatonin 5 mg tab  Take one tablet by mouth at bedtime as needed.             pantoprazole DR (PROTONIX) 40 mg tablet  Take one tablet by mouth twice daily.             polyethylene glycol 3350 (MIRALAX) 17 g packet  Take one packet by mouth daily as needed.             senna/docusate (SENOKOT-S) 8.6/50 mg tablet  Take one tablet by mouth twice daily as needed.  vitamins, multiple cap  Take 1 capsule by mouth daily.               Future Appointments   Date Time Provider Department Center   12/13/2018  9:00 AM Clarita Crane, MD, [facility] Urology          I spent >31 minutes in discharge planning/coordination of care, documentation, patient/family counseling.

## 2018-12-01 NOTE — Progress Notes
Interventional Radiology Follow Up Note    Admission Date: 11/20/2018  LOS: 11 days                      Principal Problem:    Liver mass  Active Problems:    Leucocytosis    Hiccups    Liver abscess      Procedure completed: Tyler Castillo is a 84 y.o. male who is status post liver abscess drain replacement/reposition.      Assessment:  - Dressing CDI, drain holding suction with blood tinged drainage in Uresil reservoir. Other pre-existing drain WNL  - Flushing orders present on MAR  - WBC, tBili, and liver enzymes all trending downward    Plan:  1. Flush drains with 10 ml Normal Saline twice a day and as needed to maintain patency.    2. Recommend ABX coverage for at least 48 hrs post-procedure and then to primary team discretion.     3. Change dressing weekly and as needed if soiled.    4. When output drops below 30mL per 24 hr period, please obtain imaging to evaluate fluid collection. Helpful if imaging matches initial diagnostic imaging.    5. Notify Interventional Radiology: the drainage bag won't hold suction, no drainage observed in drainage bag, bleeding or drainage around tube site, skin breakdown around tube site, catheter position changed/pulled, catheter has fallen out or has been pulled out.    6. Call IR or if desired any provider may pull drain when team deems it is no longer needed.     7. Drain traverses organ (i.e. Transhepatic) so the drain will have to stay in place for 4-6 weeks for tract maturation.     We appreciate being able to participate in this patient's care. Please page with any questions or concerns.    Lorelee Market, APRN-NP   Pgr [phone]    IR Team Pager [phone] (After-hours and Weekends)  ________________________________________________________________________    Subjective  Patient reports no complaints. Procedural specifics and post-op questions answered during visit.     Review of Systems  Constitutional: negative  Ears, nose, mouth, throat, and face: negative Respiratory: negative  Cardiovascular: negative  Gastrointestinal: negative  Musculoskeletal:negative  Neurological: negative  Behavioral/Psych: negative     Medications  Scheduled Meds:ertapenem (INVanz) IVP 1 g, 1 g, Intravenous, Q24H*  lidocaine (LIDODERM) 5 % topical patch 1 patch, 1 patch, Topical, QDAY    And  Verification of Patch Placement and Integrity - Lidocaine 5%, , Transdermal, BID  melatonin tablet 5 mg, 5 mg, Oral, QHS  pantoprazole DR (PROTONIX) tablet 40 mg, 40 mg, Oral, BID(11-21)  senna/docusate (SENOKOT-S) tablet 1 tablet, 1 tablet, Oral, BID  sodium chloride PF 0.9% flush 5-10 mL, 5-10 mL, Flush, FLUSH TID  sodium chloride PF 0.9% syringe 10 mL, 10 mL, Intravenous, Q8H*  sodium chloride PF 0.9% syringe 10 mL, 10 mL, Intravenous, Q8H*    Continuous Infusions:  PRN and Respiratory Meds:alum/mag hydroxide/simeth Q4H PRN, benzonatate TID PRN, chlorproMAZINE TID PRN, polyethylene glycol 3350 QDAY PRN, simethicone Q6H PRN      Objective                       Vital Signs: Last Filed                 Vital Signs: 24 Hour Range   BP: 137/47 (07/24 0733)  Temp: 36.2 ???C (97.2 ???F) (07/24 3244)  Pulse: 67 (07/24 0733)  Respirations: 18 PER MINUTE (07/24 0733)  SpO2: 100 % (07/24 0733)  SpO2 Pulse: 65 (07/23 1100) BP: (121-150)/(47-72)   Temp:  [36.2 ???C (97.2 ???F)-36.9 ???C (98.5 ???F)]   Pulse:  [65-89]   Respirations:  [17 PER MINUTE-18 PER MINUTE]   SpO2:  [94 %-100 %]      Vitals:    11/20/18 0132 11/27/18 1514   Weight: 83.9 kg (185 lb) 83.9 kg (185 lb)         Intake/Output Summary:  (Last 24 hours)    Intake/Output Summary (Last 24 hours) at 12/01/2018 1057  Last data filed at 12/01/2018 8295  Gross per 24 hour   Intake 1050 ml   Output 2460 ml   Net -1410 ml      Stool Occurrence: 0    Physical Exam  General appearance: alert and no distress  Neurologic: Grossly normal, at baseline  Lungs: Nonlabored with normal effort  Heart: regular rate and rhythm Abdomen: soft, non-tender. Drains in place/intact as above  Extremities: extremities normal, atraumatic, no cyanosis or edema     Lab/Radiology/Other Diagnostic Tests:  Labs:    24-hour labs:    Results for orders placed or performed during the hospital encounter of 11/20/18 (from the past 24 hour(s))   CBC AND DIFF    Collection Time: 12/01/18  3:45 AM   Result Value Ref Range    White Blood Cells 10.6 4.5 - 11.0 K/UL    RBC 2.39 (L) 4.4 - 5.5 M/UL    Hemoglobin 8.0 (L) 13.5 - 16.5 GM/DL    Hematocrit 62.1 (L) 40 - 50 %    MCV 97.8 80 - 100 FL    MCH 33.3 26 - 34 PG    MCHC 34.1 32.0 - 36.0 G/DL    RDW 30.8 11 - 15 %    Platelet Count 427 (H) 150 - 400 K/UL    MPV 7.8 7 - 11 FL    Neutrophils 82 (H) 41 - 77 %    Lymphocytes 10 (L) 24 - 44 %    Monocytes 6 4 - 12 %    Eosinophils 1 0 - 5 %    Basophils 1 0 - 2 %    Absolute Neutrophil Count 8.79 (H) 1.8 - 7.0 K/UL    Absolute Lymph Count 1.02 1.0 - 4.8 K/UL    Absolute Monocyte Count 0.67 0 - 0.80 K/UL    Absolute Eosinophil Count 0.05 0 - 0.45 K/UL    Absolute Basophil Count 0.06 0 - 0.20 K/UL   COMPREHENSIVE METABOLIC PANEL    Collection Time: 12/01/18  3:45 AM   Result Value Ref Range    Sodium 135 (L) 137 - 147 MMOL/L    Potassium 4.2 3.5 - 5.1 MMOL/L    Chloride 105 98 - 110 MMOL/L    Glucose 97 70 - 100 MG/DL    Blood Urea Nitrogen 13 7 - 25 MG/DL    Creatinine 6.57 0.4 - 1.24 MG/DL    Calcium 7.3 (L) 8.5 - 10.6 MG/DL    Total Protein 5.7 (L) 6.0 - 8.0 G/DL    Total Bilirubin 1.1 0.3 - 1.2 MG/DL    Albumin 2.2 (L) 3.5 - 5.0 G/DL    Alk Phosphatase 846 (H) 25 - 110 U/L    AST (SGOT) 49 (H) 7 - 40 U/L    CO2 23 21 - 30 MMOL/L    ALT (SGPT) 57 (H) 7 - 56 U/L  Anion Gap 7 3 - 12    eGFR Non African American >60 >60 mL/min    eGFR African American >60 >60 mL/min   POC GLUCOSE    Collection Time: 12/01/18  8:28 AM   Result Value Ref Range    Glucose, POC 96 70 - 100 MG/DL     Radiology: Reviewed.

## 2018-12-01 NOTE — Case Management (ED)
CMA Note    Transfer packet completed and placed in patient's wall unit for discharge.       Case Management Assistant

## 2018-12-01 NOTE — Progress Notes
Chaplain Note:    Admit Date: 11/20/2018     I attempted to visit pt per consult for prayer support. Pt was asleep at time of visit, approximately 1025. I will attempt to visit again.    The spiritual care team is available as needed, 24/7, through the campus switchboard [phone]).  For immediate response, please page [phone].  For a response within 24 hours, please submit an order in O2 for a chaplain consult.       Date/Time:                      User:                                    Pager: [phone]  12/01/2018 10:38 AM Charletta Cousin

## 2018-12-01 NOTE — Progress Notes
Infectious Disease Progress Note    Today's Date:  12/01/2018  Admission Date: 11/20/2018    Reason for this consultation: Patient with history of hepatic mass of unclear etiology with prior cultures growing GPC resembling strep. Treated with ertapenem. Patient presenting with new similar appearing lesion. Please assist with antibiotic recommendations.    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 No viable tumor cells are present. ???The finding may represent reaction adjacent to mass lesion, or may represent a liver abscess.  -10/26/17 normal liver biopsy: Mild sinusoidal dilation and changes consistent with biliary obstruction. No significant fibrosis.   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 Review of outside CT by IR - felt significantly improved and drain could be removed   -11/21/17 IR drain removal. Abscessogram- no fistula -12/01/17 finished ertapenem and started amox/clav, which he finished ~12/17/17  -12/22/17 another 14-day course of augmentin prescribed  -11/19/18 patient presented to [facility] ED with weakness, malaise, right-sided chest discomfort  -11/19/18 CT abdomen/pelvis: 7.2 x 8.9 cm heterogenous mass within hepatic dome posterior segment of R hepatic lobe; increased in size from prior study (July 19); interval development of 2.9x 3.0cm heterogenous mass within L hepatic lobe; interval development of mild (R) sided hydronephrosis secondary to 9mm calculus of right UPJ  -7/12 CXR: Cardiomegaly, chronic small right pleural effusion, right basilar atelectasis  -Leukocytosis and elevated liver enzymes  -7/13 BC x 2 sets NTD  -7/13 fungal blood cultures x2 sets NTD  -7/14 CT abdomen and pelvis: There is persistent cholelithiasis. The hepatic abscess previously demonstrated in the caudal right lobe adjacent to the gallbladder is not distinctly visualized and has likely resolved. Development of 2 new adjacent hepatic low-density lesions in the cephalad liver. The larger is centered in hepatic segments 7/8 and measures approximately 10 x 7 x 9 cm (axial image 17 and coronal image 87). The adjacent smaller abscess is within hepatic segment 4A and measures approximately 5 x 3.5 x 4.5 cm (axial image 15 and coronal image 50).  -7/15 IR placed a drain in the smaller left-sided hepatic abscess; 20 mL of bloody purulent fluid was aspirated.  No biopsy done and no drain placed in the large right-sided hepatic abscess  -7/15 smaller left-sided hepatic abscess fluid GS: Many PMN, no org; routine culture NTD.  Anaerobic culture + MG Fusobacterium nucleatum  -7/15 fluid cytology: Acute inflammation and debris. Negative for malignant cells.   -7/16 large right-sided hepatic abscess fluid GS: Many PMN, no org; routine culture + E coli.  Anaerobic culture +  LG Fusobacterium nucleatum -7/16 ERCP: Injection of contrast revealed a 5 mm CBD with no distal filling defect suggestive of stone. A guide wire was passed into the intrahepatics and a 7 mm traction biliary sphincterotomy was performed. There was no bleeding. Then a stone extraction balloon was swept multiple times through the duct no stone was extracted. Following this???a balloon occlusion cholangiogram was performed and did showe small intrahepatic ducts with a connection between the right anterior duct with one of the abscess.  -7/20 CT abdomen/pelvis w/: Decreased dominant segment 7/8 and unchanged segment 4A multiloculated fluid collections. Interval placement of percutaneous drainage catheters with development of internal foci of gas. Diverticulosis with development of mild uncomplicated diverticulitis at the junction of the descending and sigmoid colon. Nonobstructive 1.0 cm right UVJ and left lower pole renal calculi. Urology consultation should be considered. Asymmetric hypoenhancement of visualized right superficial and deep femoral veins which is likely flow related, though ultrasound recommended to exclude deep venous thrombosis. Cholelithiasis.  -7/23 removal and replacement of a L hepatic abscess drain (to minimize hiccups).    Right hydronephrosis  Right UPJ nephrolithiasis  -11/19/2018 CT abdomen and pelvis: 9mm calculus of right UPJ  -7/14 CT abdomen and pelvis: Right ureteropelvic junction calculus measuring 6 mm with mild upstream pelvocaliectasis without frank hydronephrosis. Adjacent urothelial thickening likely reflects inflammation. Persistent small left lower pole nonobstructing renal calculus.    History of AKI- resolved    History of Viral pericarditis status-post pericardiectomy in 1993    Hyperlipidemia    Former tobacco use (quit 1978)    Recommendations:     1. Continue ertapenem   2. Anticipate at least 1 month of IV antibiotic therapy  3. While on ertapenem, the patient will need weekly CBC with differential and CMP  4. We will keep the drains in place after discharge.  Monitor drains' output  5. Anticipate repeat CT abdomen and pelvis in around 3 weeks  6. I will arrange for follow-up with me  7. Follow-up cultures until final  8. Okay to discharge to rehab facility  __________________________________________________________________________________    Subjective/Interval History     Patient remains afebrile  Still having intermittent hiccups  No shortness of breath  Coughing up some white phlegm  No nausea, vomiting or diarrhea  No abdominal pain  No dysuria???    WBC 10.6  Hb 8  Platelets 427  Creatinine 0.78  AST 49-improved  ALT 57-improved  ALP 427-improved   T bili 1.1-improved    Drains output in the past 24 hours:   #1:  60 mL (flushed with 30 mL)  #2:  40 mL (flushed with 30 mL)      ???  Antimicrobial Start date End date   Zosyn   6/17 x 1 dose; 6/19-6/25/19   11/19/2018 x1 dose ???   Ertapenem  6/25-7/25/19; 11/20/2018  active   Augmentin  7/25-8/10; 12/22/17 ???01/05/18   ??? ??? ???   ??? ??? ???   ??? ??? ???   ??? ??? ???   Estimated Creatinine Clearance: 76.9 mL/min (based on SCr of 0.78 mg/dL).    Medications   Scheduled Meds:ertapenem (INVanz) IVP 1 g, 1 g, Intravenous, Q24H*  lidocaine (LIDODERM) 5 % topical patch 1 patch, 1 patch, Topical, QDAY    And  Verification of Patch Placement and Integrity - Lidocaine 5%, , Transdermal, BID  melatonin tablet 5 mg, 5 mg, Oral, QHS  pantoprazole DR (PROTONIX) tablet 40 mg, 40 mg, Oral, WJX(91-47)  senna/docusate (SENOKOT-S)  tablet 1 tablet, 1 tablet, Oral, BID  sodium chloride PF 0.9% flush 5-10 mL, 5-10 mL, Flush, FLUSH TID  sodium chloride PF 0.9% syringe 10 mL, 10 mL, Intravenous, Q8H*  sodium chloride PF 0.9% syringe 10 mL, 10 mL, Intravenous, Q8H*    Continuous Infusions:    PRN and Respiratory Meds:alum/mag hydroxide/simeth Q4H PRN, benzonatate TID PRN, chlorproMAZINE TID PRN, polyethylene glycol 3350 QDAY PRN, simethicone Q6H PRN      Allergies   No Known Allergies Physical Examination                          Vital Signs: Last                  Vital Signs: 24 Hour Range   BP: 137/47 (07/24 0733)  Temp: 36.2 ???C (97.2 ???F) (07/24 1610)  Pulse: 67 (07/24 0733)  Respirations: 18 PER MINUTE (07/24 0733)  SpO2: 100 % (07/24 0733)  SpO2 Pulse: 65 (07/23 1100) BP: (114-150)/(47-72)   Temp:  [36.2 ???C (97.2 ???F)-36.9 ???C (98.5 ???F)]   Pulse:  [61-89]   Respirations:  [16 PER MINUTE-24 PER MINUTE]   SpO2:  [93 %-100 %]      General appearance: Alert, lethargic, in NAD  HENT: No thrush, coated tongue  Lungs: Clear breath sounds anteriorly  Heart: Regular rhythm, reg rate, no murmur, rub, gallop  Abdomen: Soft, tender around the new drain, non-distended, J-Vac drain in the right upper lateral abdomen, J-Vac drain in the right upper quadrant   Ext: No clubbing, cyanosis; mild bilateral lower extremity edema  Skin: No rash    Lines:   R arm PICC line 7/19  PIV      Lab Review   Hematology  Recent Labs     11/29/18  0310 11/30/18  0535 12/01/18  0345   WBC 13.0* 12.6* 10.6   HGB 8.2* 8.0* 8.0*   HCT 23.3* 23.0* 23.3*   PLTCT 397 404* 427*   INR 1.4*  --   --      Chemistry  Recent Labs     11/29/18  0310 11/30/18  0535 12/01/18  0345   NA 135* 134* 135*   K 4.2 4.1 4.2   CL 103 103 105   CO2 26 23 23    BUN 20 15 13    CR 0.91 0.85 0.78   GFR >60 >60 >60   GLU 107* 103* 97   CA 7.1* 7.1* 7.3*   ALBUMIN 2.3* 2.2* 2.2*   ALKPHOS 477* 440* 427*   AST 46* 40 49*   ALT 63* 59* 57*   TOTBILI 1.5* 1.4* 1.1       Microbiology, Radiology and other Diagnostics Review   Microbiology data reviewed.    Pertinent radiology images viewed.    7/21 RLE US/Doppler:  No right lower extremity deep venous thrombosis    7/20 CT abdomen/pelvis w/:  1. Decreased dominant segment 7/8 and unchanged segment 4A multiloculated   fluid collections which remain most compatible with recurrent abscesses.   Interval placement of percutaneous drainage catheters with development of   internal foci of gas. 2. Diverticulosis with development of mild uncomplicated diverticulitis at   the junction of the descending and sigmoid colon.  3. Nonobstructive 1.0 cm right UVJ and left lower pole renal calculi.   Urology consultation should be considered.  4. Asymmetric hypoenhancement of visualized right superficial and deep   femoral veins which is likely flow related,  though ultrasound recommended   to exclude deep venous thrombosis.  5. Cholelithiasis.    7/15 CXR:  1. Heart size is mildly enlarged with mild pulmonary vascular congestion.  2. Small right pleural effusion with bibasilar opacities compatible with   atelectasis and/or pneumonia.    7/14 CT abdomen/pelvis:  1. Development of 2 new adjacent hepatic abscess/masses in the cephalad   liver, as described, the larger measuring up to 10 cm. Given prior   clinical history, these likely reflect recurrent abscesses however   underlying hepatic mass such as cholangiocarcinoma could appear similar.  2. Right ureteropelvic junction calculus measuring 6 mm with mild upstream   pelvocaliectasis without frank hydronephrosis. Adjacent urothelial   thickening likely reflects inflammation. Urinalysis is recommended.  3. Persistent small left lower pole nonobstructing renal calculus.  4. Marked sigmoid colon diverticulosis.  5. 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      Theotis Burrow, MD  Division of Infectious Diseases   Pager [phone]

## 2018-12-01 NOTE — Care Plan
Problem: Infection, Risk of  Goal: Absence of infection  Outcome: Goal Achieved  Pt discharging to facility for long term abx therapy.      Problem: Falls, High Risk of  Goal: Absence of falls-Adult Patient  Outcome: Goal Achieved  Pt had fall bundle in place during stay. Pt cooperative.      Problem: Discharge Planning  Goal: Participation in plan of care  Outcome: Goal Achieved  Goal: Knowledge regarding plan of care  Outcome: Goal Achieved  Goal: Prepared for discharge  Outcome: Goal Achieved  Pt and family informed on dc plan. Pt agreeable and all questions addressed before dc.      Problem: Pain  Goal: Management of pain  Outcome: Goal Achieved  Goal: Knowledge of pain management  Outcome: Goal Achieved  Goal: Progress Toward Pain Management Goals  Outcome: Goal Achieved  Pt complained of no pain at time of dc.      Problem: Mobility/Activity Intolerance  Goal: Maximize functional ADL's and mobility outcomes  Outcome: Goal Achieved     Problem: Self-Care Deficit  Goal: Maximize ADL functioning  Outcome: Goal Achieved     Problem: Infection, Risk of, Central Venous Catheter-Associated Bloodstream Infection  Goal: Absence of CVC Associated Bloodstream infection  Outcome: Goal Achieved  Pt PICC line cleaned with CHG daily. No s/s of infection at time of dc.

## 2018-12-04 ENCOUNTER — Encounter: Admit: 2018-12-04 | Discharge: 2018-12-04

## 2018-12-04 DIAGNOSIS — K750 Abscess of liver: Secondary | ICD-10-CM

## 2018-12-04 NOTE — Telephone Encounter
ACTIVE OPAT: 12/04/2018  Hosp D/C Date: 12/01/2018  ID Dr. Julieta Gutting  Next f/u: 8/19 at 1330 w/ Ct abd/pelvis 8/19 at 10am at [facility] 4 hrs prior.    Diagnosis:  Recurrent right and left hepatic abscesses    --Invanz 1g IV QD  Start date: 11/20/18    End date: ?    Labs: Wkly Q Tues CBC w/Diff, CMP  Start: 12/05/2018  (Lab orders in O2)    Line: Picc  J-Vac    Home Services: RE: Tyler Castillo     DOB:08-12-1934    [facility]: Ph= [facility] confirmed orders. Madaline Savage does not think pt will be with them very long. Plan is for pt to go in daily for IV abx infusions once he is able to move better. Fax appt info so that they can give it to family.   [facility] (Fax: [phone]) Fax any orders to the pharmacy plus appt info.

## 2018-12-06 ENCOUNTER — Encounter: Admit: 2018-12-06 | Discharge: 2018-12-06

## 2018-12-06 DIAGNOSIS — K750 Abscess of liver: Secondary | ICD-10-CM

## 2018-12-06 LAB — CBC AND DIFF: Lab: 6.6

## 2018-12-06 LAB — COMPREHENSIVE METABOLIC PANEL
Lab: 0.9
Lab: 11
Lab: 362
Lab: 4.2
Lab: 41
Lab: 61

## 2018-12-13 ENCOUNTER — Encounter: Admit: 2018-12-13 | Discharge: 2018-12-13

## 2018-12-13 ENCOUNTER — Encounter: Admit: 2018-12-13 | Discharge: 2018-12-14

## 2018-12-13 ENCOUNTER — Ambulatory Visit: Admit: 2018-12-13 | Discharge: 2018-12-13

## 2018-12-13 DIAGNOSIS — E785 Hyperlipidemia, unspecified: Secondary | ICD-10-CM

## 2018-12-13 DIAGNOSIS — N200 Calculus of kidney: Principal | ICD-10-CM

## 2018-12-13 DIAGNOSIS — N19 Unspecified kidney failure: Secondary | ICD-10-CM

## 2018-12-13 DIAGNOSIS — Z1159 Encounter for screening for other viral diseases: Secondary | ICD-10-CM

## 2018-12-13 NOTE — Progress Notes
Date of Service: 12/13/2018     Subjective:             Tyler Castillo is a 84 y.o. male who presents for nephrolithiasis, right ureteropelvic junction stone, 1 cm.    History of Present Illness    Tyler Castillo is a 84 y.o. 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 This was an incidental finding on CT scan done for his hepatic abscesses. There is no hydronephrosis on the scan, and he has been asymptomatic. No flank pain, nausea, vomiting. He had a prior stone surgery, he believes ureteroscopy about 30 years ago, unsure of laterality. This was done in [place], [place]. He has not had any incidents of kidney stones since then until this current episode. He is currently in a swing bed after recent hospital admission for liver abscess, and he is hopeful that he will be going home soon. He lives alone and could stay with his son after outpatient surgery.    He has a history of pericarditis requiring excision of the pericardium, no history of myocardial infarction, no lung disease/inhaler use. He does not take blood thinners, and he has not had issues with anesthesia in the past or problems with bleeding or blood clots after surgery.     Medical History:   Diagnosis Date   ??? Hyperlipidemia    ??? Renal failure        Surgical History:   Procedure Laterality Date   ??? ESOPHAGOGASTRODUODENOSCOPY WITH CONTROL OF BLEEDING - FLEXIBLE N/A 10/31/2017    Performed by Buckles, Vinnie Level, MD at [facility] ENDO   ??? ENDOSCOPIC RETROGRADE CHOLANGIOPANCREATOGRAPHY WITH SPHINCTEROTOMY/ PAPILLOTOMY N/A 11/23/2018    Performed by Comer Locket, MD at [facility] ENDO   ??? ESOPHAGOGASTRODUODENOSCOPY WITH ENDOSCOPIC ULTRASOUND EXAMINATION - FLEXIBLE N/A 11/23/2018    Performed by Comer Locket, MD at [facility] ENDO   ??? ESOPHAGOGASTRODUODENOSCOPY WITH BIOPSY - FLEXIBLE  11/23/2018 Performed by Comer Locket, MD at [facility] ENDO   ??? HX APPENDECTOMY     ??? LUNG SURGERY         Family History   Problem Relation Age of Onset   ??? Stroke Mother        Current Outpatient Medications   Medication Sig Dispense Refill   ??? acetaminophen (TYLENOL) 500 mg tablet Take 500 mg by mouth every 6 hours as needed for Pain. Max of 4,000 mg of acetaminophen in 24 hours.     ??? alum/mag hydroxide/simeth (MYLANTA) 200/200/20 mg/5 mL oral suspension Take 30 mL by mouth every 4 hours as needed.     ??? chlorproMAZINE (THORAZINE) 10 mg tablet Take one tablet by mouth three times daily as needed. For hiccups.     ??? ertapenem (INVANZ) 1 g/10 mL IVP Administer 10 mL through vein every 24 hours. Stop date 12/22/2018 or as otherwise stated by infectious disease physician     ??? melatonin 5 mg tab Take one tablet by mouth at bedtime as needed.     ??? naproxen sodium (ALEVE) 220 mg capsule Take 220 mg by mouth.     ??? pantoprazole DR (PROTONIX) 40 mg tablet Take one tablet by mouth twice daily.     ??? polyethylene glycol 3350 (MIRALAX) 17 g packet Take one packet by mouth daily as needed.     ??? pravastatin (PRAVACHOL) 40 mg tablet Take 40  mg by mouth.     ??? senna/docusate (SENOKOT-S) 8.6/50 mg tablet Take one tablet by mouth twice daily as needed.     ??? tadalafiL (CIALIS) 20 mg tablet 1 Tab, daily, PO, Route to [facility] PRN, 0 Number of Refills, 19549, TAB     ??? vitamins, multiple cap Take 1 capsule by mouth daily.       No current facility-administered medications for this visit.        No Known Allergies    Social History     Socioeconomic History   ??? Marital status: Married     Spouse name: Not on file   ??? Number of children: 1   ??? Years of education: Not on file   ??? Highest education level: Not on file   Occupational History   ??? Not on file   Tobacco Use   ??? Smoking status: Former Smoker     Packs/day: 0.50     Years: 20.00     Pack years: 10.00     Types: Cigarettes     Last attempt to quit: 10/24/1977 Years since quitting: 41.1   ??? Smokeless tobacco: Never Used   Substance and Sexual Activity   ??? Alcohol use: Yes   ??? Drug use: Never   ??? Sexual activity: Not on file   Other Topics Concern   ??? Not on file   Social History Narrative   ??? Not on file       Review of Systems   Constitutional: Negative for activity change, appetite change, chills, diaphoresis, fatigue, fever and unexpected weight change.   HENT: Positive for hearing loss. Negative for congestion, mouth sores and sinus pressure.    Eyes: Negative for visual disturbance.   Respiratory: Negative for apnea, cough, chest tightness and shortness of breath.    Cardiovascular: Negative for chest pain, palpitations and leg swelling.   Gastrointestinal: Negative for abdominal pain, blood in stool, constipation, diarrhea, nausea, rectal pain and vomiting.   Genitourinary: Negative for decreased urine volume, difficulty urinating, discharge, dysuria, enuresis, flank pain, frequency, genital sores, hematuria, penile pain, penile swelling, scrotal swelling, testicular pain and urgency.   Musculoskeletal: Negative for arthralgias, back pain, gait problem and myalgias.   Skin: Negative for rash and wound.   Neurological: Negative for dizziness, tremors, seizures, syncope, weakness, light-headedness, numbness and headaches.   Hematological: Negative for adenopathy. Does not bruise/bleed easily.   Psychiatric/Behavioral: Negative for decreased concentration and dysphoric mood. The patient is not nervous/anxious.        Objective:         ??? acetaminophen (TYLENOL) 500 mg tablet Take 500 mg by mouth every 6 hours as needed for Pain. Max of 4,000 mg of acetaminophen in 24 hours.   ??? alum/mag hydroxide/simeth (MYLANTA) 200/200/20 mg/5 mL oral suspension Take 30 mL by mouth every 4 hours as needed.   ??? chlorproMAZINE (THORAZINE) 10 mg tablet Take one tablet by mouth three times daily as needed. For hiccups. ??? ertapenem (INVANZ) 1 g/10 mL IVP Administer 10 mL through vein every 24 hours. Stop date 12/22/2018 or as otherwise stated by infectious disease physician   ??? melatonin 5 mg tab Take one tablet by mouth at bedtime as needed.   ??? naproxen sodium (ALEVE) 220 mg capsule Take 220 mg by mouth.   ??? pantoprazole DR (PROTONIX) 40 mg tablet Take one tablet by mouth twice daily.   ??? polyethylene glycol 3350 (MIRALAX) 17 g packet Take one packet by mouth  daily as needed.   ??? pravastatin (PRAVACHOL) 40 mg tablet Take 40 mg by mouth.   ??? senna/docusate (SENOKOT-S) 8.6/50 mg tablet Take one tablet by mouth twice daily as needed.   ??? tadalafiL (CIALIS) 20 mg tablet 1 Tab, daily, PO, Route to [facility]), PRN, 0 Number of Refills, 19549, TAB   ??? vitamins, multiple cap Take 1 capsule by mouth daily.     Vitals:    12/13/18 0921   BP: (!) 142/105   BP Source: Arm, Right Upper   Patient Position: Sitting   Pulse: 88   Resp: 16   Temp: 36.7 ???C (98 ???F)   TempSrc: Oral   Weight: 80.7 kg (178 lb)   Height: 170.2 cm (67)   PainSc: Zero     Body mass index is 27.88 kg/m???.     Physical Exam   Constitutional: He appears well-developed and well-nourished. No distress.   HENT:   Head: Normocephalic and atraumatic.   Nose: Nose normal.   Eyes: Right eye exhibits no discharge. Left eye exhibits no discharge. No scleral icterus.   Cardiovascular: Normal rate.   Pulmonary/Chest: Effort normal. No respiratory distress.   Genitourinary:    Genitourinary Comments: No costovertebral angle tenderness      Musculoskeletal:         General: No deformity.   Neurological: He is alert.   Skin: Skin is warm and dry. He is not diaphoretic.   Psychiatric: He has a normal mood and affect. His behavior is normal.       I reviewed the relevant imaging, which is notable for 1 cm right ureteropelvic junction stone, no hydronephrosis.       Assessment and Plan:  Tyler Castillo is a 84 y.o. 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 We discussed proceeding with ureteroscopy laser lithotripsy given the size of the stone, and he would like to do this. Discussed risks including ureteral injury and need for temporary stent placement after surgery. He also has a lower pole stone in the left kidney, 7 mm, and I recommended observation at this time rather than bilateral ureteroscopy.    - to OR 01/18/19 for right ureteroscopy laser lithotripsy   - observation of 7 mm left lower pole stone  - urine culture  - pre-anesthesia testing          Patient seen and discussed with Clarita Crane, MD, who directed plan of care.    Colon Flattery  PGY-3 Urology    Orders Placed This Encounter   ??? CULTURE-URINE W/SENSITIVITY   ??? Nurse Communication: Surgery Information (REQUIRED for all Surgeries/Procedures)     ATTESTATION    I personally performed the key portions of the E/M visit, discussed case with resident and concur with resident documentation of history, physical exam, assessment, and treatment plan unless otherwise noted.    Staff name:  Beckie Busing, MD, [facility] Date:  12/13/2018

## 2018-12-14 ENCOUNTER — Encounter: Admit: 2018-12-14 | Discharge: 2018-12-14

## 2018-12-14 DIAGNOSIS — K750 Abscess of liver: Secondary | ICD-10-CM

## 2018-12-14 LAB — CULTURE-URINE W/SENSITIVITY

## 2018-12-14 LAB — COMPREHENSIVE METABOLIC PANEL
Lab: 1.1
Lab: 14
Lab: 4.2

## 2018-12-14 LAB — CBC AND DIFF: Lab: 5.3

## 2018-12-18 ENCOUNTER — Encounter: Admit: 2018-12-18 | Discharge: 2018-12-18

## 2018-12-18 NOTE — Telephone Encounter
Cheryl RN from [facility] called to inform us pt is being discharged to home. She has set up pt to go there for daily infusions until pt comes in for /fu and CT on 12/27/2018. No other questions at this time. Pt has both apptointments info.

## 2018-12-22 ENCOUNTER — Encounter: Admit: 2018-12-22 | Discharge: 2018-12-22

## 2018-12-22 DIAGNOSIS — K750 Abscess of liver: Secondary | ICD-10-CM

## 2018-12-22 LAB — CBC AND DIFF: Lab: 5.6

## 2018-12-22 LAB — COMPREHENSIVE METABOLIC PANEL
Lab: 1
Lab: 14
Lab: 17
Lab: 184
Lab: 21
Lab: 4.5

## 2018-12-25 LAB — CULTURE-FUNGAL,BLOOD W/SENSITIVITY

## 2018-12-25 LAB — CULTURE-FUNGAL,OTHER

## 2018-12-27 ENCOUNTER — Encounter: Admit: 2018-12-27 | Discharge: 2018-12-27

## 2018-12-27 ENCOUNTER — Ambulatory Visit: Admit: 2018-12-27 | Discharge: 2018-12-27

## 2018-12-27 DIAGNOSIS — K750 Abscess of liver: Secondary | ICD-10-CM

## 2018-12-27 DIAGNOSIS — N19 Unspecified kidney failure: Secondary | ICD-10-CM

## 2018-12-27 DIAGNOSIS — E785 Hyperlipidemia, unspecified: Secondary | ICD-10-CM

## 2018-12-27 LAB — COMPREHENSIVE METABOLIC PANEL
Lab: 1
Lab: 149
Lab: 15
Lab: 17
Lab: 18
Lab: 4.3

## 2018-12-27 LAB — CBC AND DIFF: Lab: 4.9

## 2018-12-27 MED ORDER — AMOXICILLIN-POT CLAVULANATE 875-125 MG PO TAB
1 | ORAL_TABLET | Freq: Two times a day (BID) | ORAL | 0 refills | 7.00000 days | Status: AC
Start: 2018-12-27 — End: ?

## 2018-12-27 MED ORDER — IOHEXOL 350 MG IODINE/ML IV SOLN
100 mL | Freq: Once | INTRAVENOUS | 0 refills | Status: CP
Start: 2018-12-27 — End: ?
  Administered 2018-12-27: 15:00:00 100 mL via INTRAVENOUS

## 2018-12-27 MED ORDER — SODIUM CHLORIDE 0.9 % IJ SOLN
50 mL | Freq: Once | INTRAVENOUS | 0 refills | Status: CP
Start: 2018-12-27 — End: ?
  Administered 2018-12-27: 15:00:00 50 mL via INTRAVENOUS

## 2018-12-27 NOTE — Progress Notes
Subjective:       History of Present Illness  Tyler Castillo is a 84 y.o. male with a history of hyperlipidemia, viral pericarditis s/p pericardiectomy in 1993, liver abscess, who was admitted on 11/20/18 with recurrent liver abscesses.  ???  The patient was admitted to [place] from 6/16 to 11/03/2017 as a transfer from  [facility] with right upper quadrant pain following a fall and contusion to right upper quadrant on 10/19/17. ???He was found to have a 7 x 9 cm mass in the right hepatic lobe concerning for malignancy.  He also had perihepatic fluid that tracked around the liver and into the peritoneal space. ???This was confirmed by CT and then had an MRI on 10/25/17. ???There was initial concern for malignancy. On 10/26/2017, he underwent a liver mass biopsy which showed necrosis and mixed inflammatory infiltrates.  Negative stains for fungus, AFB and bacteria. Tumor markers including CA-19-9, CEA and AFP were negative. Aspiration of the fluid collection was performed on 10/26/17. ???That revealed pus and a drain was placed. ???Fluid was sent for culture. ???Gram stain showed gram-positive cocci resembling strep. Cultures were negative for bacteria (not cultured anaerobically), fungi and AFB. ???Blood cultures that were obtained prior to this procedure on 10/24/2017 grew Fusobacterium nucleatum. ???Follow-up blood cultures were negative on 6/22. ???Panorex was negative. He was seen by GI. They recommended repeat biopsy following completion of treatment for infection. Strongyloidiasis and amoeba antibodies were negative. ???On 11/01/17, there was decreased output from the drain and he went back to IR for evaluation. ???There was a persistent abscess cavity with migration of contrast medial into the larger more medial abscess pocket. ???The drain was upsized. Following finalization of his cultures he was placed on ertapenem. ???He was discharged to skilled nursing facility.  ?????? Following discharge he had poor output from his drain. ???A follow-up CT was done as an outpatient on 11/09/17 in [place]. IR felt that it had significantly improved. ???The right hepatic mass was still present but it is seemed to be getting smaller. ???There was a small amount of persistent perihepatic fluid collection.  Patient returned to [place] on 11/21/17 for drain removal. He finished his course of ertapenem on 7/25, and had his PICC line removed on 7/30.  ???  Patient was seen for follow-up by Dr. Nadara Eaton on 12/22/17 with repeat CT chest, abdomen, pelvis.  The CT chest showed stable lung nodules, stable extensive pleural calcifications and small right pleural effusion.  CT abdomen and pelvis showed improved liver abscess measuring 5.8 x 5.1 cm, no biliary ductal dilatation, bilateral nonobstructing nephrolithiasis.  Due to persistent lesion, the patient was prescribed a 14-day course of Augmentin.  ???  On 11/19/18, the patient presented to [facility] ED with weakness, malaise, and right-sided chest/flank pain.  He had weakness and fatigue for 2 weeks prior to presentation.  The right flank pain also started around 2 weeks, while the right lower anterior chest pain started a few days before presentation.  His labs showed leukocytosis and elevated LFTs. CXR showed cardiomegaly, chronic small right pleural effusion, right basilar atelectasis. CT abdomen/pelvis showed 7.2 x 8.9 cm heterogenous mass within the hepatic dome posterior segment of R hepatic lobe; increased???in size from prior study (July 2019); interval development???of 2.9x 3.0cm heterogenous mass within L hepatic lobe; interval development???of mild (R) sided hydronephrosis secondary???to 9mm calculus of right UPJ.  The patient was given a dose of Zosyn and he was transferred to [facility] for further care.  ???  The  patient remained afebrile at [facility].  His UA showed mild pyuria.    Routine and fungal blood cultures were negative. He was started on ertapenem. CT abdomen and pelvis on 7/14 showed persistent cholelithiasis. The hepatic abscess previously demonstrated in the caudal right lobe adjacent to the gallbladder is not distinctly visualized and has likely resolved. Development of 2 new adjacent hepatic low-density lesions in the cephalad liver. The larger is centered in hepatic segments 7/8 and measures approximately 10 x 7 x 9 cm. The adjacent smaller abscess is within hepatic segment 4A and measures approximately 5 x 3.5 x 4.5 cm. On 7/15, IR placed a drain in the smaller left-sided hepatic abscess; 20 mL of bloody purulent fluid was aspirated.  No biopsy done and no drain placed in the large right-sided hepatic abscess. The routine culture was negative while the anaerobic culture was positive for MG Fusobacterium nucleatum.  Fluid cytology showed acute inflammation and debris. Negative for malignant cells.  On 7/16, a drain was placed in the large right-sided hepatic abscess fluid; routine culture + E coli.  Anaerobic culture + LG Fusobacterium nucleatum.  Due to concern for biliary disease, an ERCP was done on 7/16 and revealed a 5 mm CBD with no distal filling defect suggestive of stone, small intrahepatic ducts with a connection between the right anterior duct with one of the abscesses. follow-up CT abdomen and pelvis on 7/20 showed decreased dominant segment 7/8 and unchanged segment 4A multiloculated fluid collections. Interval placement of percutaneous drainage catheters with development of internal foci of gas. Diverticulosis with development of mild uncomplicated diverticulitis at the junction of the descending and sigmoid colon. Nonobstructive 1.0 cm right UVJ and left lower pole renal calculi. Cholelithiasis. Due to persistent hiccups, the patient underwentremoval and replacement of a L hepatic abscess drain on 7/23.  With planned to perform a colonoscopy but the patient was not able to tolerate the prep and therefore was canceled. The patient was discharged home on 7/24 to continue outpatient parenteral antibiotic therapy.    Patient is seen today for follow up. He is feel good overall. He still has fatigue but no real weakness or abdominal pain. No F/C/S. No N/V/D. Has been flushing the drains once a day. He hasn't been getting out more than the flush. The fluid is brownish in color. Getting outpatient ertapenem infusions at the local hospital. No problems with the PICC line.    ???  Antimicrobial Start date End date   Zosyn   6/17 x 1 dose; 6/19-6/25/19   11/19/2018 x1 dose ???   Ertapenem  6/25-7/25/19; 11/20/2018  active   Augmentin  7/25-8/10; 12/22/17 ???01/05/18   ??? ??? ???   ??? ??? ???   ??? ??? ???   ??? ??? ???        Review of Systems   Constitutional: Positive for activity change (Improving) and fatigue. Negative for appetite change, chills, diaphoresis and fever.   HENT: Negative for congestion, ear discharge, ear pain, postnasal drip, rhinorrhea, sinus pressure, sinus pain, sore throat and trouble swallowing.    Eyes: Negative for visual disturbance.   Respiratory: Positive for shortness of breath (with significant exertion). Negative for chest tightness.    Cardiovascular: Positive for leg swelling (RLE edema). Negative for chest pain and palpitations.   Gastrointestinal: Negative for abdominal pain, diarrhea, nausea and vomiting.   Genitourinary: Negative for difficulty urinating, dysuria, frequency and hematuria.   Musculoskeletal: Negative for arthralgias and back pain.   Skin: Negative for rash and  wound.   Neurological: Negative for dizziness, seizures, syncope, light-headedness and headaches.   Hematological: Bruises/bleeds easily (Bruises easily).   Psychiatric/Behavioral: Negative for confusion and dysphoric mood. The patient is not nervous/anxious.        Social History     Socioeconomic History   ??? Marital status: Married     Spouse name: Not on file   ??? Number of children: 1   ??? Years of education: Not on file ??? Highest education level: Not on file   Occupational History   ??? Not on file   Tobacco Use   ??? Smoking status: Former Smoker     Packs/day: 0.50     Years: 20.00     Pack years: 10.00     Types: Cigarettes     Last attempt to quit: 10/24/1977     Years since quitting: 41.2   ??? Smokeless tobacco: Never Used   Substance and Sexual Activity   ??? Alcohol use: Yes   ??? Drug use: Never   ??? Sexual activity: Not on file   Other Topics Concern   ??? Not on file   Social History Narrative   ??? Not on file         Objective:         ??? acetaminophen (TYLENOL) 500 mg tablet Take 500 mg by mouth every 6 hours as needed for Pain. Max of 4,000 mg of acetaminophen in 24 hours.   ??? alum/mag hydroxide/simeth (MYLANTA) 200/200/20 mg/5 mL oral suspension Take 30 mL by mouth every 4 hours as needed.   ??? chlorproMAZINE (THORAZINE) 10 mg tablet Take one tablet by mouth three times daily as needed. For hiccups.   ??? ertapenem (INVANZ) 1 g/10 mL IVP Administer 10 mL through vein every 24 hours. Stop date 12/22/2018 or as otherwise stated by infectious disease physician   ??? melatonin 5 mg tab Take one tablet by mouth at bedtime as needed.   ??? naproxen sodium (ALEVE) 220 mg capsule Take 220 mg by mouth.   ??? pantoprazole DR (PROTONIX) 40 mg tablet Take one tablet by mouth twice daily.   ??? polyethylene glycol 3350 (MIRALAX) 17 g packet Take one packet by mouth daily as needed.   ??? pravastatin (PRAVACHOL) 40 mg tablet Take 40 mg by mouth.   ??? senna/docusate (SENOKOT-S) 8.6/50 mg tablet Take one tablet by mouth twice daily as needed.   ??? tadalafiL (CIALIS) 20 mg tablet 1 Tab, daily, PO, Route to [facility]), PRN, 0 Number of Refills, 19549, TAB   ??? vitamins, multiple cap Take 1 capsule by mouth daily.     Vitals:    12/27/18 1321   BP: (!) 148/54   BP Source: Arm, Left Upper   Patient Position: Sitting   Pulse: 59   Temp: 36.7 ???C (98.1 ???F)   Weight: 80.7 kg (178 lb)   Height: 170.2 cm (67)   PainSc: Zero Body mass index is 27.88 kg/m???.     Physical Exam  General appearance: Alert, oriented x 3, in NAD  HENT: Normocephalic, atraumatic, no sinus tenderness, oropharynx clear  Eyes: PERRL, EOM grossly intact, Conj nl  Neck: Supple, no lymphadenopathy  Lungs:  Clear breath sounds, no wheezing, rhonchi or crackles  Heart: Regular rhythm, reg rate, no murmur, rub, gallop  Abdomen: Soft, non-tender, non-distended, normoactive bowel sounds, 2 JP drains in the right upper quadrant with very mild erythema at the exit sites but no tenderness.  No hepatosplenomegaly, no masses  Ext:  No clubbing, cyanosis; no lower extremity edema  Skin: No rash  Lymph: No cervical, axillary or inguinal adenopathy    Lines: R arm PICC line 7/19    Labs:  Sodium   Date/Time Value Ref Range Status   12/01/2018 03:45 AM 135 (L) 137 - 147 MMOL/L Final   11/30/2018 05:35 AM 134 (L) 137 - 147 MMOL/L Final     Potassium   Date/Time Value Ref Range Status   12/26/2018 04:00 PM 4.3  Final   12/19/2018 04:00 PM 4.5  Final     Blood Urea Nitrogen   Date/Time Value Ref Range Status   12/26/2018 04:00 PM 15.0  Final   12/19/2018 04:00 PM 14.0  Final     Creatinine   Date/Time Value Ref Range Status   12/26/2018 04:00 PM 1.03  Final   12/19/2018 04:00 PM 1.06  Final     AST (SGOT)   Date/Time Value Ref Range Status   12/26/2018 04:00 PM 18  Final   12/19/2018 04:00 PM 21  Final     ALT (SGPT)   Date/Time Value Ref Range Status   12/26/2018 04:00 PM 17  Final   12/19/2018 04:00 PM 17  Final     Alk Phosphatase   Date/Time Value Ref Range Status   12/26/2018 04:00 PM 149  Final   12/19/2018 04:00 PM 184  Final     Total Bilirubin   Date/Time Value Ref Range Status   12/01/2018 03:45 AM 1.1 0.3 - 1.2 MG/DL Final   96/08/5407 81:19 AM 1.4 (H) 0.3 - 1.2 MG/DL Final     White Blood Cells   Date/Time Value Ref Range Status   12/26/2018 04:00 PM 4.9  Final   12/19/2018 04:00 PM 5.6  Final     RBC   Date/Time Value Ref Range Status 12/01/2018 03:45 AM 2.39 (L) 4.4 - 5.5 M/UL Final   11/30/2018 05:35 AM 2.37 (L) 4.4 - 5.5 M/UL Final     Hemoglobin   Date/Time Value Ref Range Status   12/26/2018 04:00 PM 10.5  Final   12/19/2018 04:00 PM 10.0  Final     Platelet Count   Date/Time Value Ref Range Status   12/26/2018 04:00 PM 217  Final   12/19/2018 04:00 PM 255  Final            Assessment:    Recurrent right and left hepatic abscesses???different location from last year's lesion  R hepatic mass/abscess???treated June-August 2019  History of loculated fluid collection anterior to segment 5   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 No viable tumor cells are present. ???The finding may represent reaction adjacent to mass lesion, or may represent a liver abscess.  -10/26/17 normal liver biopsy: Mild sinusoidal dilation and changes consistent with biliary obstruction. No significant fibrosis.   -10/24/17 BC x 2 + Fusobacterium nucletum (1/2 sets)  -10/26/17 abscess adjacent to liver GS: Moderate PMN, moderate GPC resembling Strep; routine, fungal, AFB cultures negative (no anaerobic culture)  -6/21 Strongyloids/amebiasis negative  -10/29/17 BC x2 sets negative  -10/29/2017 Panorex- neg  -10/31/17 CT abdomen: Fluid at drain decreased, unchanged persistent fluid caudal aspect  of liver  -11/01/17 perihepatic abscess GS: Many PMN, no org; routine and fungal cultures negative  -11/09/17 CT abd/pelvis: Hepatic mass, perihepatic fluid collection. Review of outside CT by IR - felt significantly improved and drain could be removed   -11/21/17 IR drain removal. Abscessogram- no fistula -12/01/17 finished ertapenem and started amox/clav, which he finished ~12/17/17  -12/22/17 another 14-day course of augmentin prescribed  -11/19/18 patient presented to [facility] ED with weakness, malaise, right-sided chest discomfort  -11/19/18 CT abdomen/pelvis: 7.2 x 8.9 cm heterogenous mass within hepatic dome posterior segment of R hepatic lobe; increased???in size from prior study (July 19); interval development???of 2.9x 3.0cm heterogenous mass within L hepatic lobe; interval development???of mild (R) sided hydronephrosis secondary???to 9mm calculus of right UPJ  -7/12 CXR: Cardiomegaly, chronic small right pleural effusion, right basilar atelectasis  -Leukocytosis and elevated liver enzymes  -7/13 routine BC x 2 sets negative  -7/13 fungal blood cultures x2 sets negative  -7/14 CT abdomen and pelvis: There is persistent cholelithiasis. The hepatic abscess previously demonstrated in the caudal right lobe adjacent to the gallbladder is not distinctly visualized and has likely resolved. Development of 2 new adjacent hepatic low-density lesions in the cephalad liver. The larger is centered in hepatic segments 7/8 and measures approximately 10 x 7 x 9 cm (axial image 17 and coronal image 87). The adjacent smaller abscess is within hepatic segment 4A and measures approximately 5 x 3.5 x 4.5 cm (axial image 15 and coronal image 50).  -7/15 IR placed a drain in the smaller left-sided hepatic abscess; 20 mL of bloody purulent fluid was aspirated.  No biopsy done and no drain placed in the large right-sided hepatic abscess  -7/15 smaller left-sided hepatic abscess fluid GS: Many PMN, no org; routine culture negative.  Anaerobic culture + MG Fusobacterium nucleatum  -7/15 fluid cytology: Acute inflammation and debris. Negative for malignant cells.   -7/16 large right-sided hepatic abscess fluid GS: Many PMN, no org; routine culture + E coli.  Anaerobic culture + LG Fusobacterium nucleatum -7/16 ERCP: Injection of contrast revealed a 5 mm CBD with no distal filling defect suggestive of stone. A guide wire was passed into the intrahepatics and a 7 mm traction biliary sphincterotomy was performed. There was no bleeding. Then a stone extraction balloon was swept multiple times through the duct no stone was extracted. Following this???a balloon occlusion cholangiogram was performed and did show some inflammation small intrahepatic ducts with a connection between the right anterior duct with one of the abscess.  -7/20 CT abdomen/pelvis w/: Decreased dominant segment 7/8 and unchanged segment 4A multiloculated fluid collections. Interval placement of percutaneous drainage catheters with development of internal foci of gas. Diverticulosis with development of mild uncomplicated diverticulitis at the junction of the descending and sigmoid colon. Nonobstructive 1.0 cm right UVJ and left lower pole renal calculi. Urology consultation should be considered. Asymmetric hypoenhancement of visualized right superficial and deep femoral veins which is likely flow related, though ultrasound recommended to exclude deep venous thrombosis. Cholelithiasis.  -7/23 removal and replacement of a L hepatic abscess drain (to minimize hiccups).  -8/19 CT abdomen/pelvis:???Decreased size of hepatic abscesses. No residual fluid about the left hepatic lobe catheter. Migration of a 0.7 cm right renal calculus into the proximal ureter with development of mild right hydronephrosis.   ???  Right hydronephrosis  Right UPJ nephrolithiasis  -11/19/2018 CT abdomen and pelvis: 9mm calculus of right UPJ  -7/14 CT abdomen and pelvis: Right ureteropelvic junction calculus measuring 6 mm  with mild upstream pelvocaliectasis without frank hydronephrosis. Adjacent urothelial thickening likely reflects inflammation. Persistent small left lower pole nonobstructing renal calculus.  -8/19 CT abdomen/pelvis:???Migration of a 0.7 cm right renal calculus into the proximal ureter with development of mild right hydronephrosis.   ???  History of AKI- resolved  ???  History of Viral pericarditis status-post pericardiectomy in 1993  ???  Hyperlipidemia  ???  Former tobacco use (quit 1978)    Plan:    1. Continue ertapenem until 8/30  2. Discontinue the drains on 8/25 in IR-ordered and scheduled; discussed CT findings with IR  3. Continue checking weekly CBC with differential and CMP until the patient is off ertapenem  4. Once off IV ertapenem, the patient should start augmentin 875 mg PO twice daily for 14 days (starting 01/08/19)  5. Patient should be evaluated by General surgery to discuss cholecystectomy and sigmoid colectomy since gallstones and mild diverticulitis remain possible causes of recurrent liver abscesses.  He would like to see a surgeon closer to home.  He was asked to arrange for an appointment in the coming weeks  6. Given the migration of the right renal calculus into the proximal ureter and the development of mild right hydronephrosis, the patient should be seen by Urology soon as possible  7. Follow-up with me as needed.       The plan of care was discussed with the patient and his son. All their questions were answered.

## 2018-12-28 ENCOUNTER — Encounter: Admit: 2018-12-28 | Discharge: 2018-12-28

## 2018-12-28 NOTE — Progress Notes
Following order to be faxed to [place]. Also orders discussed with [facility].      Infectious Diseases Outpatient Orders:    Today's Date: 12/28/2018    Per Dr. Julieta Gutting    Diagnosis: Recurrent right and left hepatic abscesses    1. Please continue Invanz 1g IV QD through End date 01/07/2019    2. Continue Wkly Labs CBC w/Diff, CMP through 01/07/2019    3. Please draw labs from picc/central line. If unable to draw back blood from line then do a peripheral stick to obtain labs. Also call ID RN to report picc/central line is not drawing back blood.     4. Please Fax All Lab Results to Dr Julieta Gutting at [phone].    5.Please monitor and maintain central line per agency's protocol. Please change dressing in sterile fashion completely including Biopatch, extension tubing and adapters Every 7 days and as needed.    6. Please Remove Picc line after last dose on 01/07/2019. Call ID RN to confirm when Picc/ Central line is removed.         7. Pt has an appt to get JVac removed on 01/02/2019 in IR at [facility]     8. Due to CT results from yesterday please help encourage pt to see Urology asap.        Please Address Questions, Abnormal or Critical Lab Results:    Call Addison Bailey RN at [phone]. Monday thru Friday 8am - 4pm      After 4pm, On the Weekends, or on a Holiday: Page the ID Fellow On Call at [facility] Address: [address] [place], [place]  [address].   Medical Office Building,4th Floor, Pod [address]C         Thank You

## 2018-12-28 NOTE — Telephone Encounter
Pt just called me and said he is going to see Urology in [place]. Urologist name is Loura Back  ph= [phone] and pt has an appt with him for this coming Monday 01/01/2019.     Dr Julieta Gutting and Dr Estevan Ryder notified.     Last ID note and CT report from yesterday 12/27/2018 faxed to Dr Rosario Adie at fax # [phone]. The phone number pt provided for Dr Rosario Adie is incorrect so correct ph # [phone].

## 2018-12-29 ENCOUNTER — Encounter: Admit: 2018-12-29 | Discharge: 2018-12-29

## 2018-12-29 DIAGNOSIS — K750 Abscess of liver: Secondary | ICD-10-CM

## 2018-12-29 NOTE — Patient Education
Dear Mr. Tyler Castillo,    Thank you for choosing [facility] Interventional Radiology for your procedure. Your appointment information is listed below:    Appointment Date: 01/02/2019  Appointment Time: 2:00 PM  Arrival Time: 1:00 PM  Location:       ? Main Campus: [address], [place], [place]  [address]  Parking: [address] Parking Garage        INTERVENTIONAL RADIOLOGY  PRE-PROCEDURE INSTRUCTIONS SEDATION    You are scheduled for a procedure in Interventional Radiology with procedural sedation.  Please follow these instructions and any direction from your Primary Care/Managing Physician.  If you have questions about your procedure or need to reschedule please call [phone].    Medication Instructions:   You may take the following medications with a small sip of water:  <ALL MEDICATIONS>         Diet Instructions:  a. (8) hours before your procedure (6:00 AM), stop your regular diet and start a clear liquid diet.  b. (6) hours before your procedure, discontinue tube feedings and chewing tobacco.  c. (2) hours before your procedure (12:00 PM/Noon) discontinue clear liquids.  You should have nothing by mouth. This includes GUM or CANDY.     Clear Liquid Diet    Water  Apple or White Grape Juice  Coffee or tea without cream   Tea  White Cranberry Juice  Chicken Bouillon or Broth (no noodles)  Soda Pop  Popsicles   Beef Bouillon or Broth (no noodles)    Day of Exam Instructions:  1. Bathe or shower with an antibacterial soap prior to your appointment.  2. If you have a history of Obstructive Sleep Apnea (OSA) bring your CPAP/BIPAP.   3. Bring a list of your current medications and the dosages.  4. Wear comfortable clothing and leave valuables at home.  5. Arrive (1) hour prior to your appointment.  This time will be spent registering, interviewing, assessing, educating and preparing you for the test.  ? You will be with [place] anywhere from 30 minutes to 6 hours after your exam depending on your procedure.  6. You may be sedated for the procedure. A responsible adult must drive you home (no Benedetto Goad, taxis or buses are allowed) and stay with you overnight. If you do not have a driver we will be unable to perform your procedure.   7. You will not be able to return to work or drive the same day if receiving sedation.

## 2018-12-29 NOTE — Progress Notes
Interventional Radiology Outpatient Scheduling Checklist      1.  Name of Procedure(s):   Dual:  Abd Drain x2 Removal    12/27/2018 2:39 PM CDT by Kalman Shan, RN     Approved?  Yes    Attending Provider Reviewing the Case:  Alli    Case Type:  Body    Modality:  Fluoroscopy    Position:  Supine    Sedation Type:  Moderate    Preferred Location:  [facility]    Physician:  Any    Comments  Review given per Primitivo Gauze APRN/ please schedule 01/02/19/ [facility]    Protocols routed to 1 IR PHYSICIAN BODY CASE REVIEW [[id number]]             2.  Date of Procedure:   01/02/2019      3.  Arrival Time:   1300      4.  Procedure Time:  4010      2.  Correct Procedural Room Assignment:  [facility] Room 4      6.  Blood Thinners Triaged and instructed per protocol: Y/N/NA:  NA  Confirmed accurate instructions sent to patient: Y/N:  NA       7.  Procedure Order Verified: Y/N:  Yes      9.  Patient instructed to have a driver: Y/N/NA:  Yes    10.  Patient instructed on NPO status: Y/N/NA:  Yes, 0600 & 1200  Confirmed accurate instructions sent to patient: Y/N:  Yes    11.  Specimen needed: Y/N/NA:  NA   Verified Order placed: Y/N:  Yes    12.  Allergies Verified:  Y/N:  Yes    13.  Is there an Iodine Allergy: Y/N:  No  Does the Procedure Require contrast: Y/N:  Yes   If so, was the IR- Contrast Allergy Pre-Procedure Medication protocol ordered: Y/NA:  NA    14.  Does the patient have labs according to IR Pre-procedure Laboratory Parameter policy: Y/N/NA:  NA per protocol   If No, was the patient instructed to obtain labs prior to procedure: Y/N/NA:  NA     15.  Will the patient need to be admitted or have a possible admission: Y/N:  No  If yes, confirmed accurate instructions sent to patient: Y/N/NA:  NA     16.  Patient States Understanding: Y/N:  Yes    17.  History of OSA:  Y/N:  No  If yes, confirm request to bring CPAP sent to patient: Y/N/NA:  NA    18. Patient declines electronic procedure instructions: Y/N:  No; email

## 2019-01-02 ENCOUNTER — Ambulatory Visit: Admit: 2019-01-02 | Discharge: 2019-01-02

## 2019-01-02 ENCOUNTER — Encounter: Admit: 2019-01-02 | Discharge: 2019-01-02

## 2019-01-02 DIAGNOSIS — N19 Unspecified kidney failure: Secondary | ICD-10-CM

## 2019-01-02 DIAGNOSIS — K750 Abscess of liver: Principal | ICD-10-CM

## 2019-01-02 DIAGNOSIS — Z4803 Encounter for change or removal of drains: Secondary | ICD-10-CM

## 2019-01-02 DIAGNOSIS — E785 Hyperlipidemia, unspecified: Secondary | ICD-10-CM

## 2019-01-02 MED ORDER — SODIUM CHLORIDE 0.9 % IV SOLP
1000 mL | Freq: Once | INTRAVENOUS | 0 refills | Status: DC
Start: 2019-01-02 — End: 2019-01-02

## 2019-01-02 MED ORDER — MIDAZOLAM 1 MG/ML IJ SOLN
1 mg | Freq: Once | INTRAVENOUS | 0 refills | Status: CP
Start: 2019-01-02 — End: ?
  Administered 2019-01-02: 19:00:00 1 mg via INTRAVENOUS

## 2019-01-02 MED ORDER — FENTANYL CITRATE (PF) 50 MCG/ML IJ SOLN
0 refills | Status: CP
Start: 2019-01-02 — End: ?
  Administered 2019-01-02 (×2): 50 ug via INTRAVENOUS

## 2019-01-02 MED ORDER — MIDAZOLAM 1 MG/ML IJ SOLN
0 refills | Status: CP
Start: 2019-01-02 — End: ?
  Administered 2019-01-02: 19:00:00 1 mg via INTRAVENOUS

## 2019-01-02 MED ORDER — FLUMAZENIL 0.1 MG/ML IV SOLN
.2 mg | INTRAVENOUS | 0 refills | Status: DC | PRN
Start: 2019-01-02 — End: 2019-01-02

## 2019-01-02 MED ORDER — NALOXONE 0.4 MG/ML IJ SOLN
.08 mg | INTRAVENOUS | 0 refills | Status: DC | PRN
Start: 2019-01-02 — End: 2019-01-02

## 2019-01-02 MED ORDER — SODIUM CHLORIDE 0.9 % IV SOLP
0 refills | Status: CP
Start: 2019-01-02 — End: ?
  Administered 2019-01-02: 19:00:00 500 mL via INTRAVENOUS

## 2019-01-02 MED ORDER — IOHEXOL 300 MG IODINE/ML IV SOLN
25 mL | Freq: Once | 0 refills | Status: CP
Start: 2019-01-02 — End: ?
  Administered 2019-01-02: 19:00:00 25 mL

## 2019-01-02 NOTE — Other
Immediate Post Procedure Note    Date:  01/02/2019                                         Attending Physician:   Dr. Mauricia Area  Performing Provider:  Ma Hillock, MD    Consent:  Consent obtained from patient.  Time out performed: Consent obtained, correct patient verified, correct procedure verified, correct site verified, patient marked as necessary.  Pre/Post Procedure Diagnosis:  Intrahepatic drain removal   Indications:  Abscess resolution    Anesthesia: fenatnayl, versed, lidocaine  Procedure(s):  Image guided intrahepatic drain removal x 2  Findings:  Successful intrahepatic drain removal (two drains). No frank pus or blood noted upon removal.       Estimated Blood Loss:  None/Negligible  Specimen(s) Removed/Disposition:  None  Complications: None  Patient Tolerated Procedure: Well  Post-Procedure Condition:  stable    Ma Hillock, MD

## 2019-01-02 NOTE — H&P (View-Only)
Pre Procedure History and Physical/Sedation Plan      Procedure Date:  01/02/2019    Planned Procedure(s): Intrahepatic drain removal    Indication for exam: Abscess resolution   ________________________________________________________________    Chief Complaint:   Intrahepatic abscess     Previous Anesthetic/Sedation History:  Reviewed    Code Status: Full Code     Allergies:  Patient has no known allergies.  Medications:  Scheduled Meds:Continuous Infusions:  PRN and Respiratory Meds:       Vital Signs:  Last Filed Vital Signs: 24 Hour Range           Pre-procedure anxiolysis plan: Midazolam  Sedation/Medication Plan: Fentanyl, Lidocaine and Midazolam  Personal history of sedation complications: Denies adverse event.   Family history of sedation complications: Denies adverse event.   Medications for Reversal: Naloxone and Flumazenil  Discussion/Reviews:  Physician has discussed risks and alternatives of this type of sedation and above planned procedures with patient    NPO Status: Acceptable  Airway:  airway assessment performed  Mallampati II (soft palate, uvula, fauces visible)  Head and Neck: no abnormalities noted  Mouth: no abnormalities noted   Anesthesia Classification:  ASA III (A patient with a severe systemic disease that limits activity, but is not incapacitating)  Pregnancy Status: N/A    Lab/Radiology/Other Diagnostic Tests  Labs:  Relevant labs reviewed    I have examined the patient, and there are no significant changes in their condition, from the previous H&P performed on 12/27/18.    Darliss Cheney, APRN-NP  Pager [phone]

## 2019-01-02 NOTE — Progress Notes
Sedation physician present in room.  Recent vitals and patient condition reviewed between sedating physician and nurse.  Reassessment completed.  Determination made to proceed with planned sedation.

## 2019-01-02 NOTE — Patient Instructions
INTERVENTIONAL RADIOLOGY DISCHARGE INSTRUCTIONS  DRAIN REMOVAL     POST-PROCEDURE ACTIVITY:   A responsible adult must drive you home after the procedure. You should not drive or operate heavy machinery or do anything that requires concentration for at least 24 hours after receiving sedation or anesthesia.   It is recommended that a responsible adult be with you until tomorrow morning.  POST-PROCEDURE SITE CARE:   You will have a small bandage over the site. Keep this dry. You may remove it in 24 hours.   You may shower in 24 hours, after removing the bandage.   Do not submerge the site underwater for 1 week (no tub bath, swimming/hot tub, etc.)   Be sure your hands are clean when touching near the site.   Do not use ointments, creams or powders on the puncture site.  DIET/MEDICATIONS:   Dennis Bast may resume your previous diet after the procedure unless otherwise restricted.   If you receive sedation or narcotic pain medications, avoid any foods or beverages containing alcohol for at least 24 hours after the procedure.   Please see the Medication Reconciliation sheet for instructions on resuming your home medications.  CALL THE DOCTOR IF:   Bright red blood has soaked the bandage.   You have severe pain not relieved by medication. Some soreness or tenderness at the site is to be expected for several days.   You have signs of infection such as: Chills, fever greater than 101F, body aches, redness, swelling or warmth at the puncture site or pus draining from the site.  You or your caregiver should call 911 if you have any severe symptoms such as excessive bleeding, severe dizziness, trouble breathing or loss of consciousness.  For any of the above symptoms or for problems or concerns related to the procedure, call [phone] for Monday-Friday 7-5. After-hours and weekends, please call [phone] and ask for the Interventional Radiology Resident on-call.

## 2019-01-02 NOTE — Progress Notes
Pt site clean, dry and intact. Discharge instructions reviewed. Pt has driver. Pt in wheelchair to front lobby to get in private vehicle.

## 2019-01-03 LAB — CBC AND DIFF
Lab: 11
Lab: 184
Lab: 4.5

## 2019-01-03 LAB — COMPREHENSIVE METABOLIC PANEL

## 2019-01-05 ENCOUNTER — Encounter: Admit: 2019-01-05 | Discharge: 2019-01-05

## 2019-01-05 DIAGNOSIS — K750 Abscess of liver: Secondary | ICD-10-CM

## 2019-01-09 NOTE — Telephone Encounter
Confirmed with Wilburn Cornelia RN that picc was removed yest 01/08/2019.    Confirmed with pt he is taking Augmentin and knows to call with any infection concerns.

## 2022-04-12 IMAGING — CT BRAIN WO(Adult)
3 of 4 series · 14 of 47 positions shown, 16 images · non-contrast
Comparison: none

PROCEDURE: BRAIN WO(Adult)
HISTORY: fall with head trauma. no prev. [place]
TECHNIQUE: Axial CT imaging of the brain was performed without contrast. No comparison study
available. This exam was performed using one or more the following dose reduction techniques:
Automated exposure control, adjustment of the mA and/or KV according to the patient's size or use of
iterative reconstruction technique. Total DLP dose measures 652 mGy with a total CTDI dose measuring

[Series 4: brain cor 5.00 hr40 s3 · coronal · 0.30mm/px · 3 of 37 slices shown]
[im 13/37  brain]
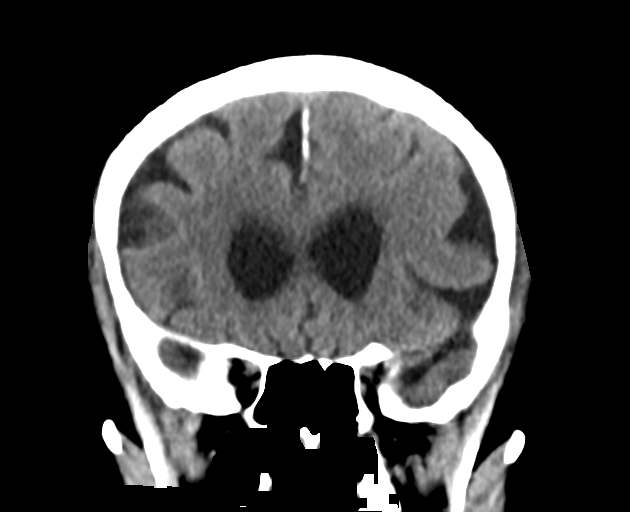
[im 17/37  brain]
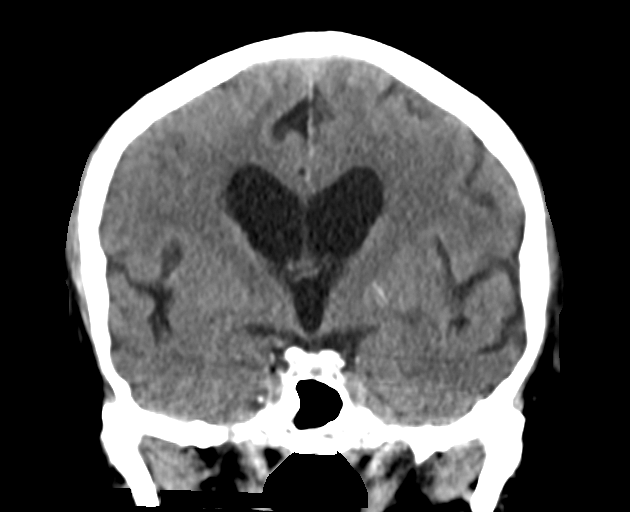
[im 21/37  brain]
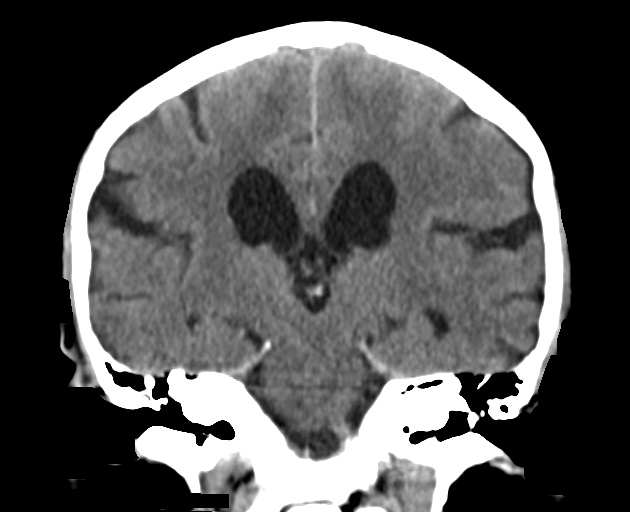

[Series 6: brain sag 5.00 hr40 s3 · sagittal · 0.30mm/px · 3 of 34 slices shown]
[im 12/34  brain]
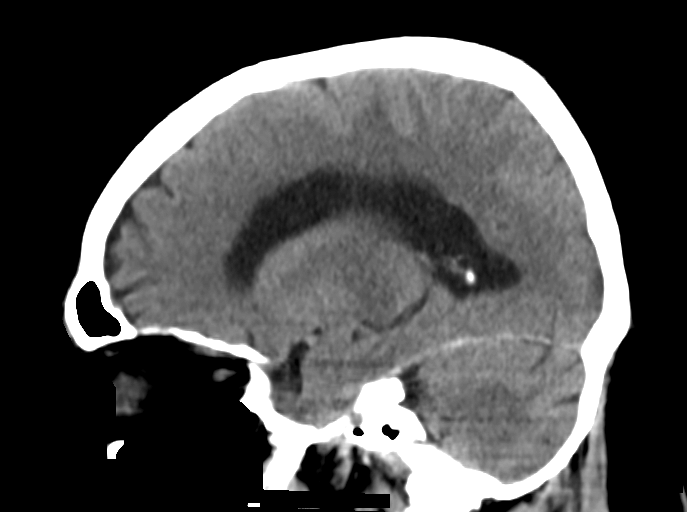
[im 17/34  brain]
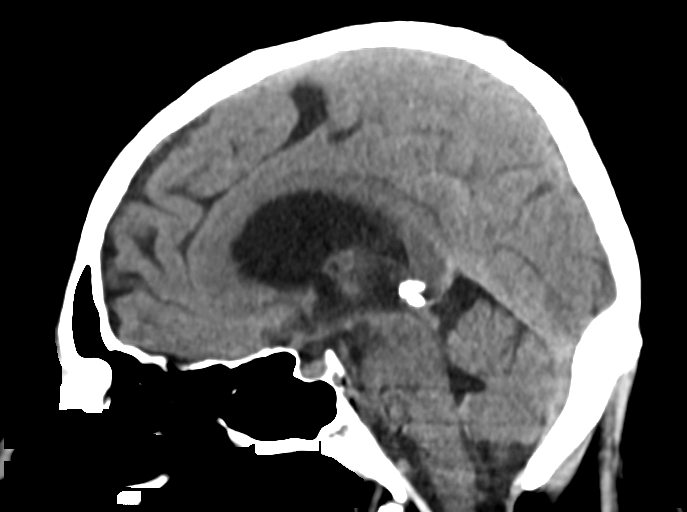
[im 23/34  brain]
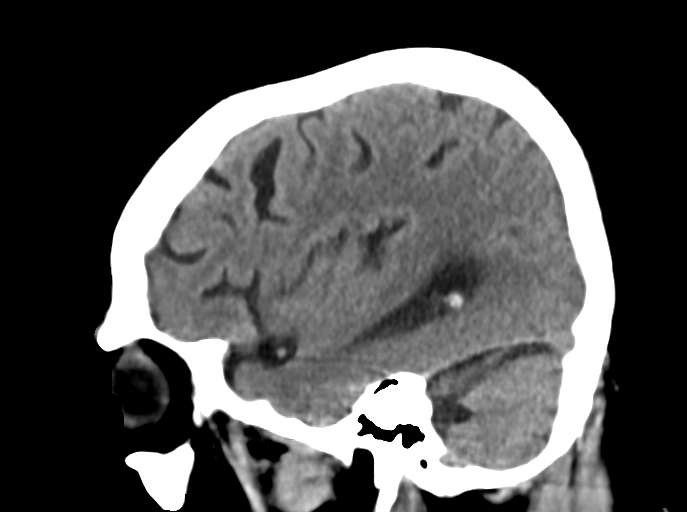

[Series 8: brain ax 2.00 hr60 s3 · axial · 0.37mm/px · z∈[-551,-429]mm · 8 of 69 slices shown, 10 images]
[im 7/69  brain]
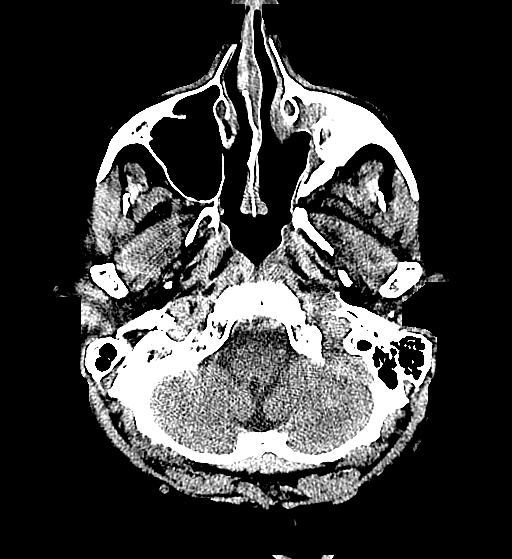
[im 7/69  bone]
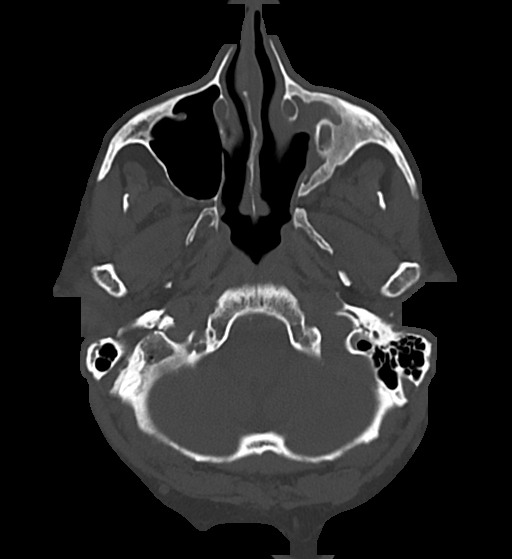
[im 14/69  brain]
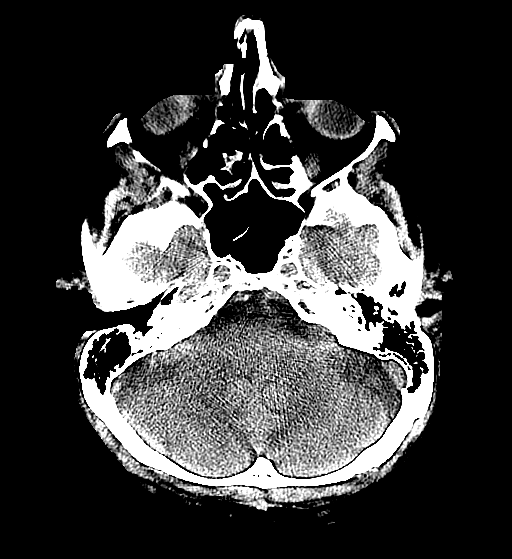
[im 21/69  brain]
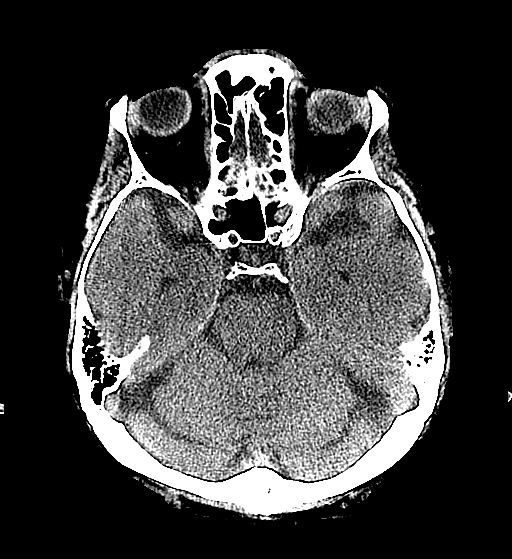
[im 31/69  brain]
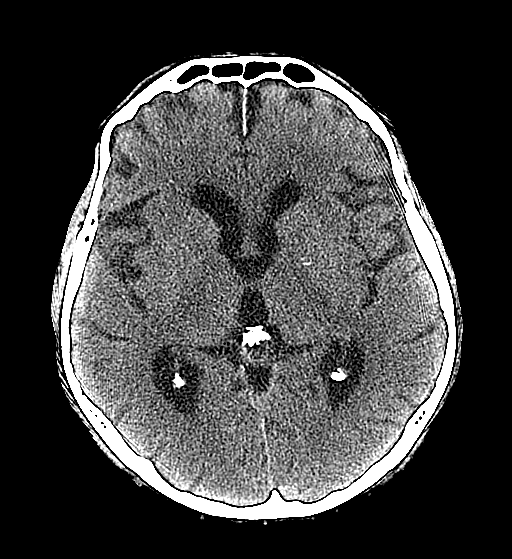
[im 38/69  brain]
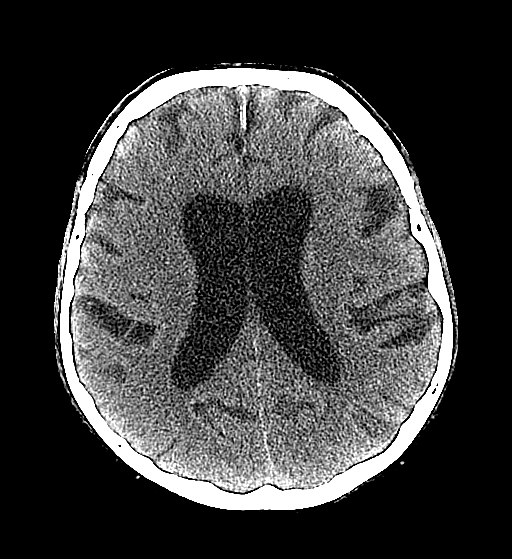
[im 38/69  bone]
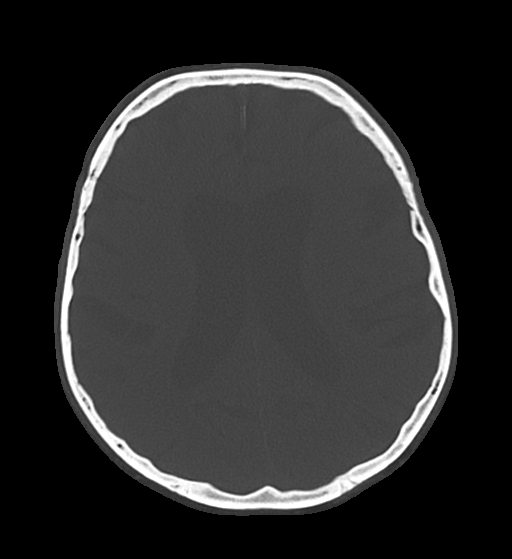
[im 48/69  brain]
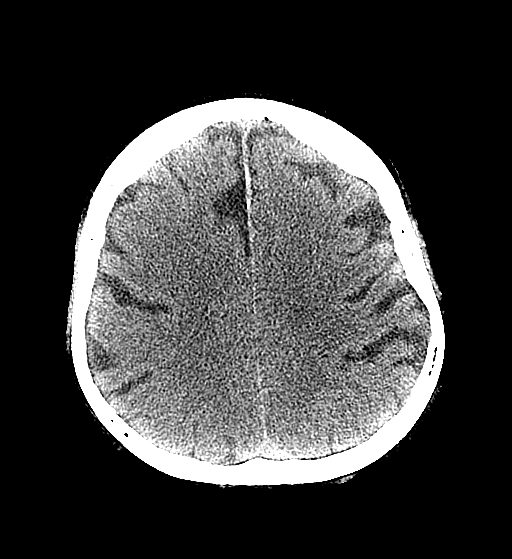
[im 55/69  brain]
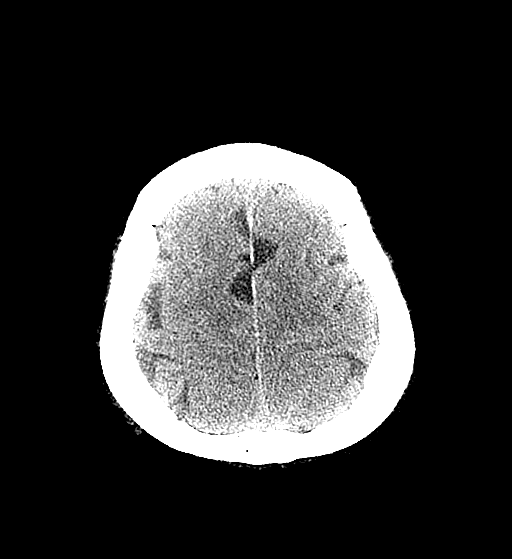
[im 62/69  brain]
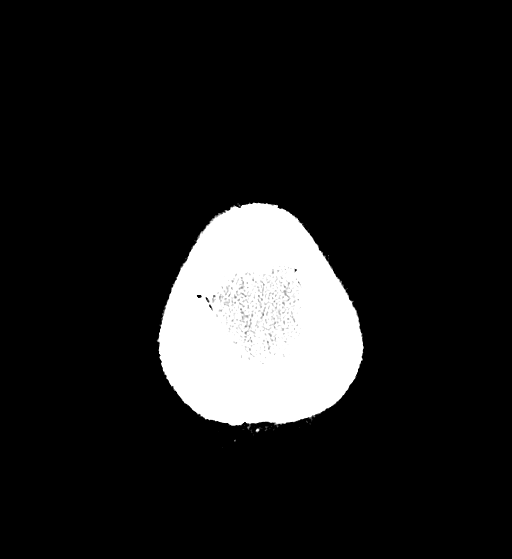

[14 of 47 positions shown; findings below may reference images not displayed]

FINDINGS: There is good gray white differentiation and sulcal detail. I don't see any acute large
vessel distribution infarction, intracranial bleed, or focal mass. Mild periventricular and
subcortical hypodensities are seen consistent with small vessel ischemic disease. The ventricles are
enlarged in size, though midline in position. The ventricular size correlates with degree of
cortical volume loss. There is cortical volume loss of both the supra and infratentorium. I don't
see any cerebellopontine angle mass. No displaced skull fracture seen. The paranasal sinuses are
clear.
IMPRESSION: 1. No acute large vessel distribution infarction, intracranial bleed, or focal mass seen.
2. Mild chronic age related periventricular and subcortical hypodensities seen which are consistent
with small vessel ischemic disease or the effects of vasculopathy.

Tech Notes:

FALL X 2, HIT POSTERIOR HEAD.

## 2022-04-12 IMAGING — CR [ID]
1 series · 1 of 1 positions shown · non-contrast
Comparison: none

PROCEDURE: [id number]
HISTORY: pt c/o soa, cough. prev CXR 05/29/21 - [facility]

[x chest ap]
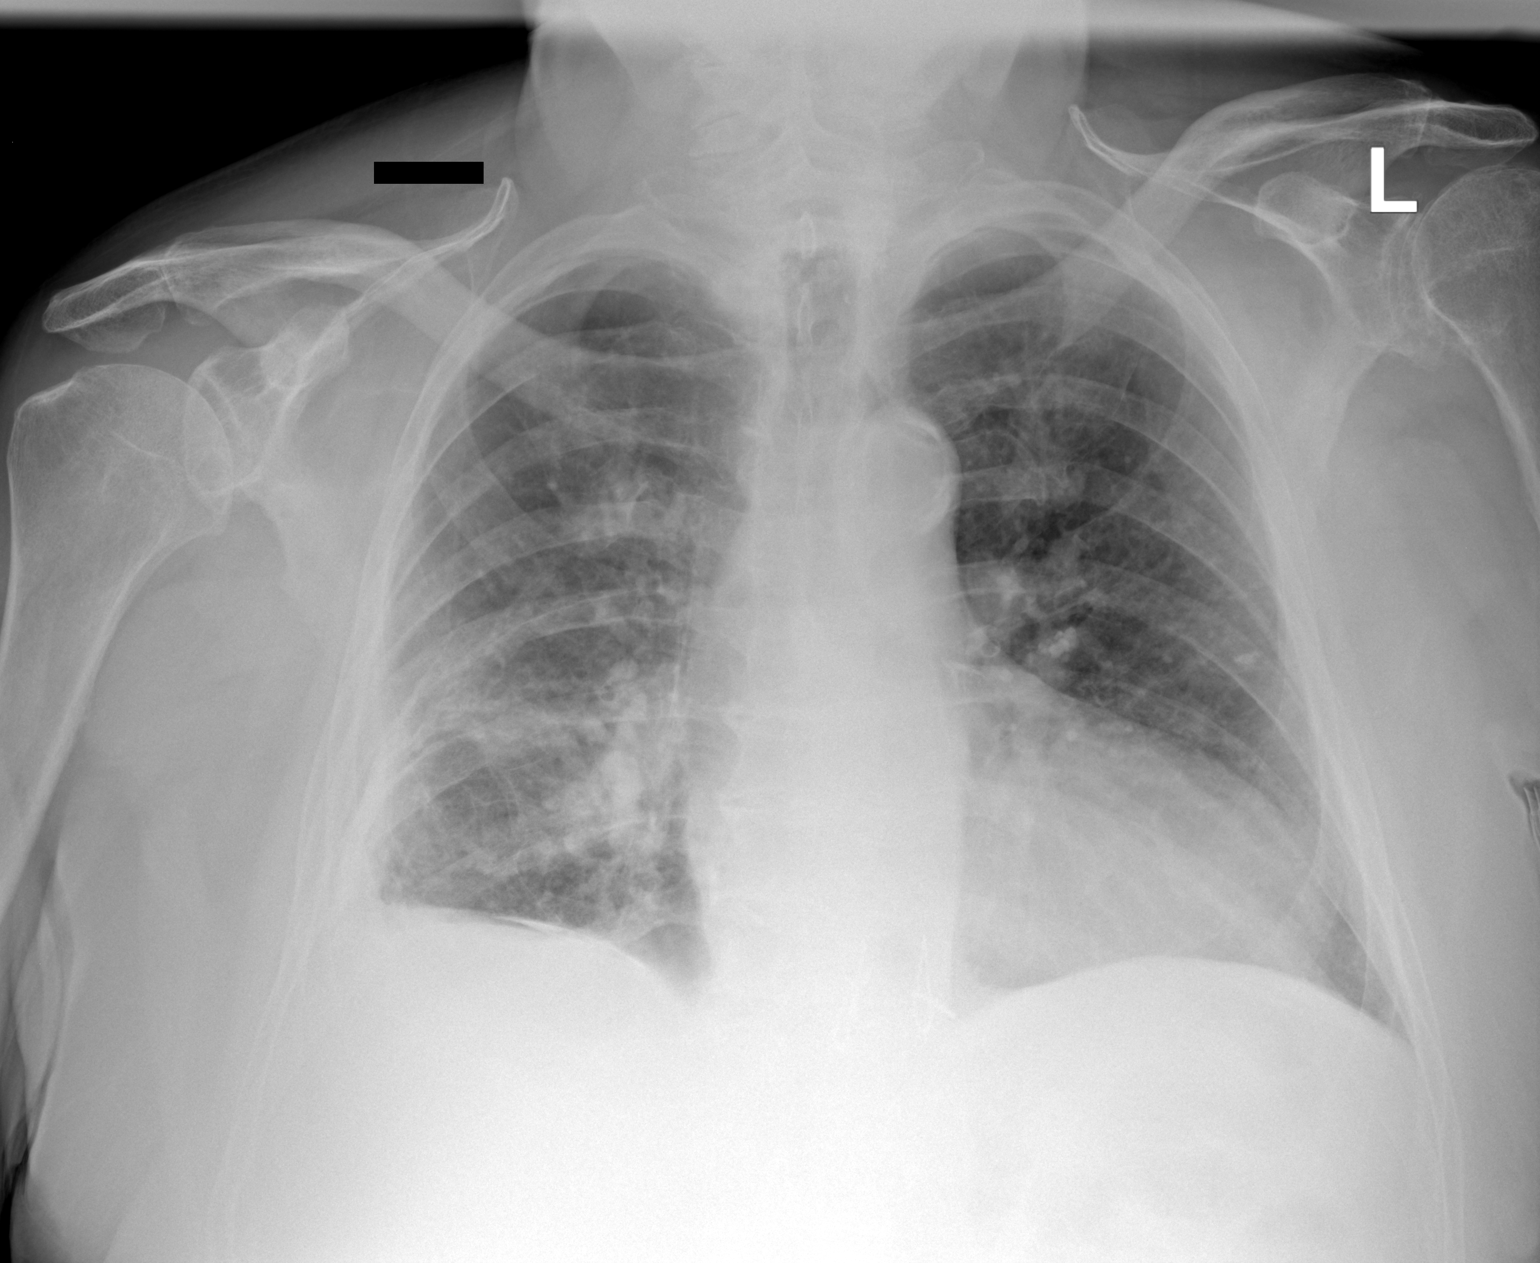

[1 of 1 positions shown; findings below may reference images not displayed]

FINDINGS: Single view of the chest with comparison examination dated 05/29/21 shows the cardiac
silhouette is normal in size. There is no pulmonary vascular congestion seen. There is bilateral
peribronchial thickening seen with recurrent patchy airspace opacity seen in the right lower lobe.
No pneumothorax is seen. No pleural effusion is seen. The visualized osseous structures show no
acute findings. Pleural scar formation is seen with calcification in the right.
IMPRESSION: 1. Streaky opacities consistent with bronchitis with recurrent right lower lobe infiltrate is seen,
follow up recommended.
2. No pleural effusion.

Tech Notes:

FALL X 2, HIT POSTERIOR HEAD.
